# Patient Record
Sex: Male | Born: 1955 | Race: White | Hispanic: No | Marital: Married | Smoking: Never smoker
Health system: Southern US, Community
[De-identification: ages and names within clinical notes are randomized; demographics above are authoritative.]

## PROBLEM LIST (undated history)

## (undated) DIAGNOSIS — E7800 Pure hypercholesterolemia, unspecified: Secondary | ICD-10-CM

## (undated) DIAGNOSIS — N529 Male erectile dysfunction, unspecified: Secondary | ICD-10-CM

## (undated) DIAGNOSIS — E109 Type 1 diabetes mellitus without complications: Secondary | ICD-10-CM

## (undated) HISTORY — DX: Type 1 diabetes mellitus without complications: E10.9

## (undated) HISTORY — PX: WRIST FRACTURE SURGERY: SHX121

## (undated) HISTORY — DX: Pure hypercholesterolemia, unspecified: E78.00

## (undated) HISTORY — PX: COLONOSCOPY: SHX174

## (undated) HISTORY — DX: Male erectile dysfunction, unspecified: N52.9

---

## 2004-08-08 ENCOUNTER — Ambulatory Visit: Payer: Self-pay | Admitting: Endocrinology

## 2004-08-15 ENCOUNTER — Ambulatory Visit: Payer: Self-pay | Admitting: Endocrinology

## 2005-06-18 ENCOUNTER — Ambulatory Visit: Payer: Self-pay | Admitting: Endocrinology

## 2005-06-24 ENCOUNTER — Ambulatory Visit: Payer: Self-pay | Admitting: Endocrinology

## 2005-07-03 ENCOUNTER — Ambulatory Visit: Payer: Self-pay | Admitting: Endocrinology

## 2005-12-02 ENCOUNTER — Ambulatory Visit: Payer: Self-pay | Admitting: Endocrinology

## 2006-05-14 ENCOUNTER — Ambulatory Visit: Payer: Self-pay | Admitting: Endocrinology

## 2006-09-15 ENCOUNTER — Ambulatory Visit: Payer: Self-pay | Admitting: Endocrinology

## 2006-09-15 LAB — CONVERTED CEMR LAB
Alkaline Phosphatase: 56 units/L (ref 39–117)
Basophils Absolute: 0.1 10*3/uL (ref 0.0–0.1)
Basophils Relative: 1.1 % — ABNORMAL HIGH (ref 0.0–1.0)
Bilirubin, Direct: 0.3 mg/dL (ref 0.0–0.3)
Chloride: 107 meq/L (ref 96–112)
Creatinine,U: 220.4 mg/dL
Eosinophils Absolute: 0.2 10*3/uL (ref 0.0–0.6)
GFR calc Af Amer: 132 mL/min
Glucose, Bld: 155 mg/dL — ABNORMAL HIGH (ref 70–99)
Ketones, ur: 40 mg/dL — AB
LDL Cholesterol: 63 mg/dL (ref 0–99)
Leukocytes, UA: NEGATIVE
Lymphocytes Relative: 29.2 % (ref 12.0–46.0)
MCHC: 32.9 g/dL (ref 30.0–36.0)
Microalb, Ur: 0.9 mg/dL (ref 0.0–1.9)
Monocytes Absolute: 0.4 10*3/uL (ref 0.2–0.7)
Platelets: 302 10*3/uL (ref 150–400)
Potassium: 4.7 meq/L (ref 3.5–5.1)
Sodium: 146 meq/L — ABNORMAL HIGH (ref 135–145)
Specific Gravity, Urine: >=1.03 (ref 1.000–1.03)
Total CHOL/HDL Ratio: 2.1
Total Protein, Urine: NEGATIVE mg/dL
Total Protein: 7.5 g/dL (ref 6.0–8.3)
Urobilinogen, UA: 0.2 (ref 0.0–1.0)
pH: 6 (ref 5.0–8.0)

## 2006-10-15 ENCOUNTER — Ambulatory Visit: Payer: Self-pay | Admitting: Endocrinology

## 2006-10-15 LAB — CONVERTED CEMR LAB
Albumin: 4.3 g/dL (ref 3.5–5.2)
Alkaline Phosphatase: 59 units/L (ref 39–117)
Basophils Absolute: 0.1 10*3/uL (ref 0.0–0.1)
Basophils Relative: 1 % (ref 0.0–1.0)
Bilirubin, Direct: 0.2 mg/dL (ref 0.0–0.3)
CO2: 31 meq/L (ref 19–32)
Calcium: 9.4 mg/dL (ref 8.4–10.5)
Chloride: 104 meq/L (ref 96–112)
Creatinine, Ser: 0.8 mg/dL (ref 0.4–1.5)
GFR calc Af Amer: 132 mL/min
HCT: 36.8 % — ABNORMAL LOW (ref 39.0–52.0)
Platelets: 321 10*3/uL (ref 150–400)
RBC: 4.17 M/uL — ABNORMAL LOW (ref 4.22–5.81)
Total Bilirubin: 1.1 mg/dL (ref 0.3–1.2)

## 2006-10-26 ENCOUNTER — Ambulatory Visit: Payer: Self-pay | Admitting: Internal Medicine

## 2007-02-18 ENCOUNTER — Encounter: Payer: Self-pay | Admitting: Endocrinology

## 2007-02-18 DIAGNOSIS — E109 Type 1 diabetes mellitus without complications: Secondary | ICD-10-CM

## 2007-02-18 HISTORY — DX: Type 1 diabetes mellitus without complications: E10.9

## 2007-02-23 ENCOUNTER — Ambulatory Visit: Payer: Self-pay | Admitting: Endocrinology

## 2007-02-23 LAB — CONVERTED CEMR LAB
Basophils Absolute: 0.1 10*3/uL (ref 0.0–0.1)
Calcium: 9.8 mg/dL (ref 8.4–10.5)
Chloride: 99 meq/L (ref 96–112)
Creatinine, Ser: 1 mg/dL (ref 0.4–1.5)
Eosinophils Absolute: 0.1 10*3/uL (ref 0.0–0.6)
GFR calc Af Amer: 102 mL/min
Glucose, Bld: 244 mg/dL — ABNORMAL HIGH (ref 70–99)
Hemoglobin: 14.2 g/dL (ref 13.0–17.0)
Hgb A1c MFr Bld: 7.1 % — ABNORMAL HIGH (ref 4.6–6.0)
MCV: 89 fL (ref 78.0–100.0)
Monocytes Absolute: 0.4 10*3/uL (ref 0.2–0.7)
Neutro Abs: 4.4 10*3/uL (ref 1.4–7.7)
Neutrophils Relative %: 61.4 % (ref 43.0–77.0)
Platelets: 306 10*3/uL (ref 150–400)
RBC: 4.69 M/uL (ref 4.22–5.81)
Sodium: 136 meq/L (ref 135–145)

## 2007-05-14 ENCOUNTER — Encounter: Payer: Self-pay | Admitting: Endocrinology

## 2007-08-19 ENCOUNTER — Encounter (INDEPENDENT_AMBULATORY_CARE_PROVIDER_SITE_OTHER): Payer: Self-pay | Admitting: *Deleted

## 2007-09-06 ENCOUNTER — Ambulatory Visit: Payer: Self-pay | Admitting: Endocrinology

## 2007-09-08 ENCOUNTER — Ambulatory Visit: Payer: Self-pay | Admitting: Endocrinology

## 2007-09-09 ENCOUNTER — Encounter: Payer: Self-pay | Admitting: Endocrinology

## 2007-12-29 ENCOUNTER — Ambulatory Visit: Payer: Self-pay | Admitting: Endocrinology

## 2007-12-29 DIAGNOSIS — E7800 Pure hypercholesterolemia, unspecified: Secondary | ICD-10-CM

## 2007-12-29 HISTORY — DX: Pure hypercholesterolemia, unspecified: E78.00

## 2007-12-29 LAB — CONVERTED CEMR LAB
ALT: 41 units/L (ref 0–53)
AST: 31 units/L (ref 0–37)
Bilirubin, Direct: 0.1 mg/dL (ref 0.0–0.3)
CO2: 29 meq/L (ref 19–32)
Calcium: 9.5 mg/dL (ref 8.4–10.5)
Chloride: 103 meq/L (ref 96–112)
Cholesterol: 140 mg/dL (ref 0–200)
Eosinophils Absolute: 0.3 10*3/uL (ref 0.0–0.7)
GFR calc non Af Amer: 84 mL/min
HDL: 48.8 mg/dL (ref >39.0)
Hgb A1c MFr Bld: 6.8 % — ABNORMAL HIGH (ref 4.6–6.0)
Ketones, ur: NEGATIVE mg/dL
LDL Cholesterol: 81 mg/dL (ref 0–99)
Lymphocytes Relative: 33.9 % (ref 12.0–46.0)
MCHC: 33.9 g/dL (ref 30.0–36.0)
MCV: 90.7 fL (ref 78.0–100.0)
Monocytes Absolute: 0.4 10*3/uL (ref 0.1–1.0)
Monocytes Relative: 7.2 % (ref 3.0–12.0)
Nitrite: NEGATIVE
RBC: 4.62 M/uL (ref 4.22–5.81)
Sodium: 139 meq/L (ref 135–145)
Specific Gravity, Urine: 1.02 (ref 1.000–1.03)
Total Bilirubin: 0.9 mg/dL (ref 0.3–1.2)
Total CHOL/HDL Ratio: 2.9
Triglycerides: 53 mg/dL (ref 0–149)
Urobilinogen, UA: 0.2 (ref 0.0–1.0)
VLDL: 11 mg/dL (ref 0–40)

## 2008-03-07 ENCOUNTER — Telehealth (INDEPENDENT_AMBULATORY_CARE_PROVIDER_SITE_OTHER): Payer: Self-pay | Admitting: *Deleted

## 2008-03-15 ENCOUNTER — Telehealth (INDEPENDENT_AMBULATORY_CARE_PROVIDER_SITE_OTHER): Payer: Self-pay | Admitting: *Deleted

## 2008-04-13 ENCOUNTER — Telehealth (INDEPENDENT_AMBULATORY_CARE_PROVIDER_SITE_OTHER): Payer: Self-pay | Admitting: *Deleted

## 2008-04-25 ENCOUNTER — Encounter: Payer: Self-pay | Admitting: Endocrinology

## 2008-08-07 ENCOUNTER — Ambulatory Visit: Payer: Self-pay | Admitting: Internal Medicine

## 2008-12-27 ENCOUNTER — Telehealth: Payer: Self-pay | Admitting: Endocrinology

## 2008-12-28 ENCOUNTER — Telehealth: Payer: Self-pay | Admitting: Endocrinology

## 2009-03-19 ENCOUNTER — Ambulatory Visit: Payer: Self-pay | Admitting: Diagnostic Radiology

## 2009-03-19 ENCOUNTER — Emergency Department (HOSPITAL_BASED_OUTPATIENT_CLINIC_OR_DEPARTMENT_OTHER): Admission: EM | Admit: 2009-03-19 | Discharge: 2009-03-19 | Payer: Self-pay | Admitting: Emergency Medicine

## 2009-03-28 ENCOUNTER — Telehealth: Payer: Self-pay | Admitting: Endocrinology

## 2009-04-16 ENCOUNTER — Telehealth: Payer: Self-pay | Admitting: Endocrinology

## 2009-05-31 ENCOUNTER — Telehealth: Payer: Self-pay | Admitting: Endocrinology

## 2009-06-08 ENCOUNTER — Ambulatory Visit: Payer: Self-pay | Admitting: Endocrinology

## 2009-06-09 LAB — CONVERTED CEMR LAB
ALT: 28 units/L (ref 0–53)
Basophils Absolute: 0.1 10*3/uL (ref 0.0–0.1)
Bilirubin Urine: NEGATIVE
CO2: 32 meq/L (ref 19–32)
Calcium: 9.7 mg/dL (ref 8.4–10.5)
Chloride: 104 meq/L (ref 96–112)
Cholesterol: 126 mg/dL (ref 0–200)
Eosinophils Absolute: 0.4 10*3/uL (ref 0.0–0.7)
Eosinophils Relative: 6 % — ABNORMAL HIGH (ref 0.0–5.0)
GFR calc non Af Amer: 83.42 mL/min (ref >60)
Glucose, Bld: 172 mg/dL — ABNORMAL HIGH (ref 70–99)
Hemoglobin: 14.5 g/dL (ref 13.0–17.0)
Hgb A1c MFr Bld: 7.7 % — ABNORMAL HIGH (ref 4.6–6.5)
Ketones, ur: NEGATIVE mg/dL
Lymphs Abs: 1.9 10*3/uL (ref 0.7–4.0)
Microalb Creat Ratio: 3.6 mg/g (ref 0.0–30.0)
Monocytes Absolute: 0.4 10*3/uL (ref 0.1–1.0)
Nitrite: NEGATIVE
PSA: 0.95 ng/mL (ref 0.10–4.00)
Platelets: 269 10*3/uL (ref 150.0–400.0)
RDW: 12.3 % (ref 11.5–14.6)
Specific Gravity, Urine: 1.025 (ref 1.000–1.030)
TSH: 1.91 microintl units/mL (ref 0.35–5.50)
Total Protein: 7.3 g/dL (ref 6.0–8.3)
Triglycerides: 45 mg/dL (ref 0.0–149.0)
Urobilinogen, UA: 0.2 (ref 0.0–1.0)
WBC: 6.3 10*3/uL (ref 4.5–10.5)

## 2009-06-21 ENCOUNTER — Ambulatory Visit: Payer: Self-pay | Admitting: Endocrinology

## 2009-09-18 ENCOUNTER — Encounter: Payer: Self-pay | Admitting: Endocrinology

## 2009-09-24 ENCOUNTER — Encounter (INDEPENDENT_AMBULATORY_CARE_PROVIDER_SITE_OTHER): Payer: Self-pay | Admitting: *Deleted

## 2009-10-09 ENCOUNTER — Encounter (INDEPENDENT_AMBULATORY_CARE_PROVIDER_SITE_OTHER): Payer: Self-pay | Admitting: *Deleted

## 2009-10-10 ENCOUNTER — Encounter (INDEPENDENT_AMBULATORY_CARE_PROVIDER_SITE_OTHER): Payer: Self-pay | Admitting: *Deleted

## 2009-10-10 ENCOUNTER — Ambulatory Visit: Payer: Self-pay | Admitting: Gastroenterology

## 2009-10-29 ENCOUNTER — Ambulatory Visit: Payer: Self-pay | Admitting: Gastroenterology

## 2009-10-29 LAB — HM COLONOSCOPY

## 2009-12-03 ENCOUNTER — Telehealth (INDEPENDENT_AMBULATORY_CARE_PROVIDER_SITE_OTHER): Payer: Self-pay | Admitting: *Deleted

## 2010-03-01 ENCOUNTER — Ambulatory Visit: Payer: Self-pay | Admitting: Endocrinology

## 2010-05-14 ENCOUNTER — Telehealth: Payer: Self-pay | Admitting: Endocrinology

## 2010-07-03 ENCOUNTER — Encounter: Payer: Self-pay | Admitting: Endocrinology

## 2010-07-23 ENCOUNTER — Telehealth (INDEPENDENT_AMBULATORY_CARE_PROVIDER_SITE_OTHER): Payer: Self-pay | Admitting: *Deleted

## 2010-08-11 ENCOUNTER — Encounter: Payer: Self-pay | Admitting: Orthopedic Surgery

## 2010-08-13 IMAGING — CR DG FOOT COMPLETE 3+V*L*
3 series · 3 of 3 positions shown · non-contrast
Comparison: None

CLINICAL DATA: Fell several weeks ago with pain

LEFT FOOT - COMPLETE 3+ VIEW

[t foot ap left]
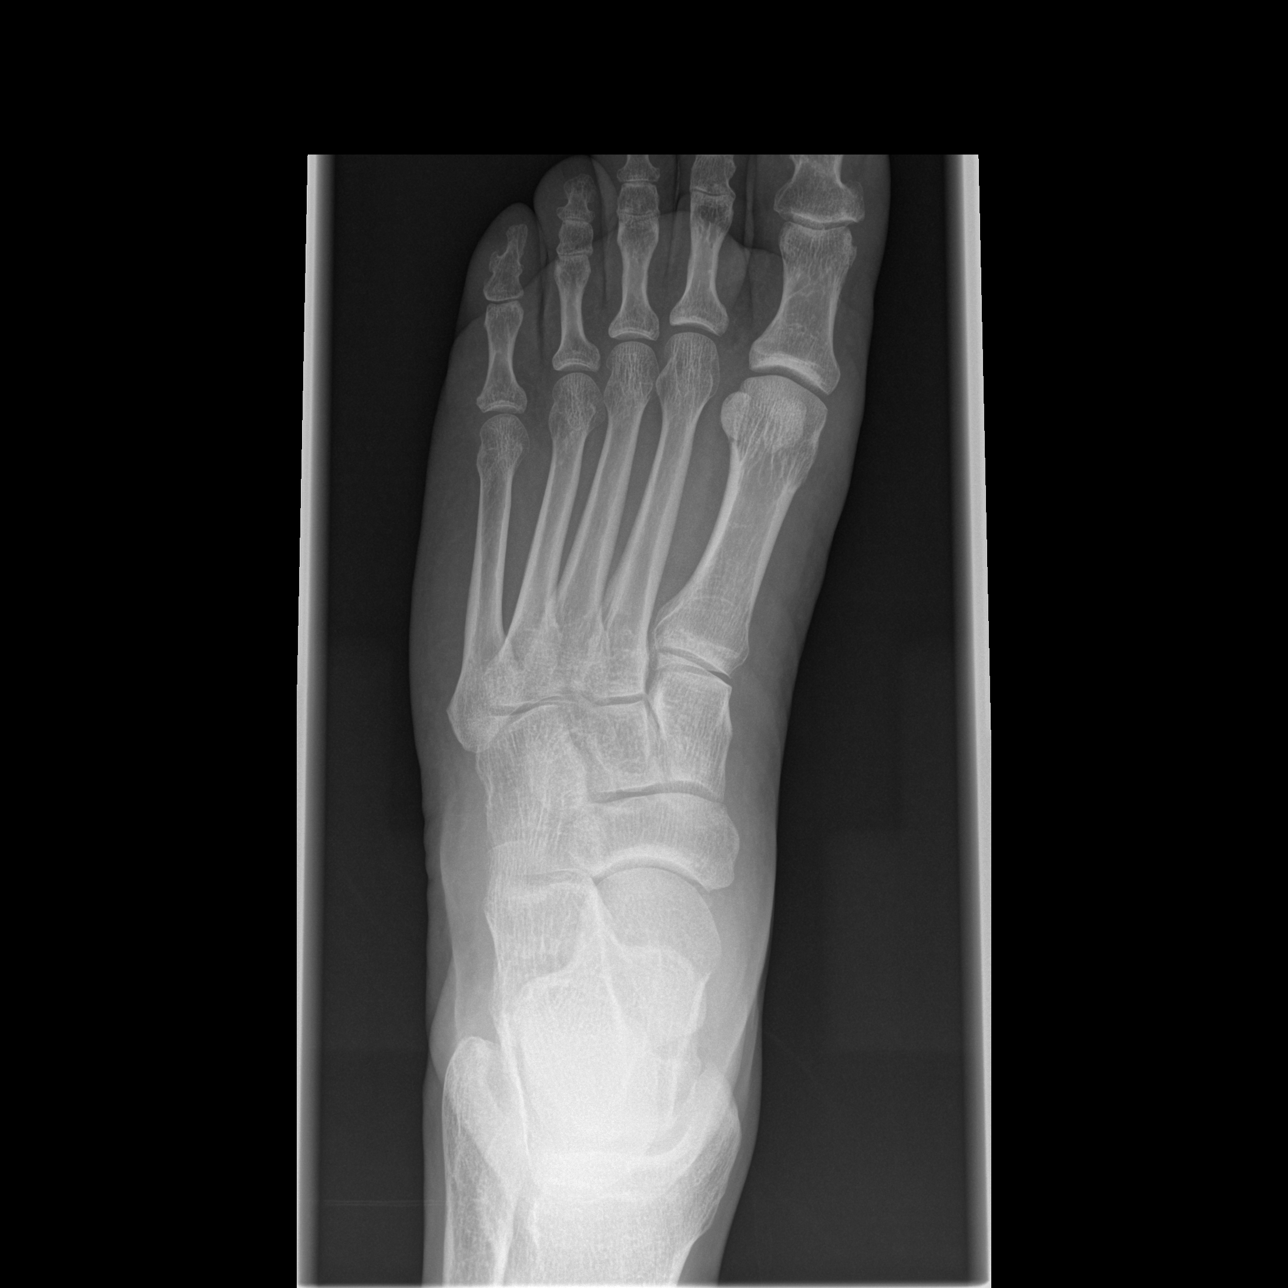

[t foot oblique left]
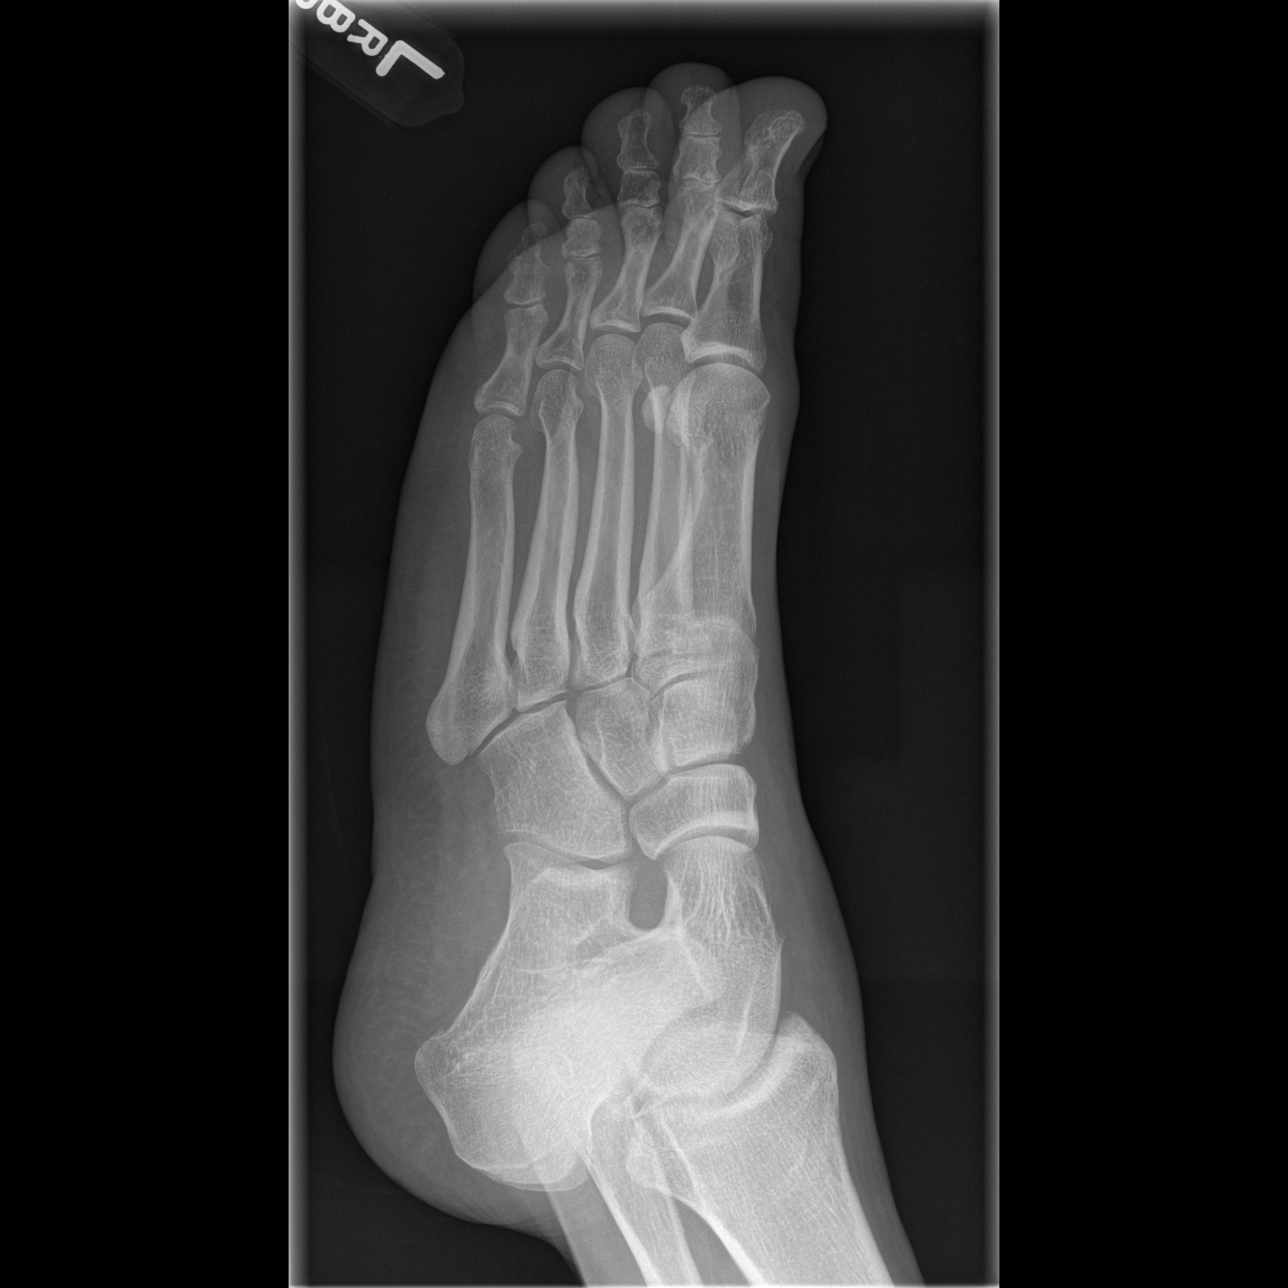

[t foot lat left]
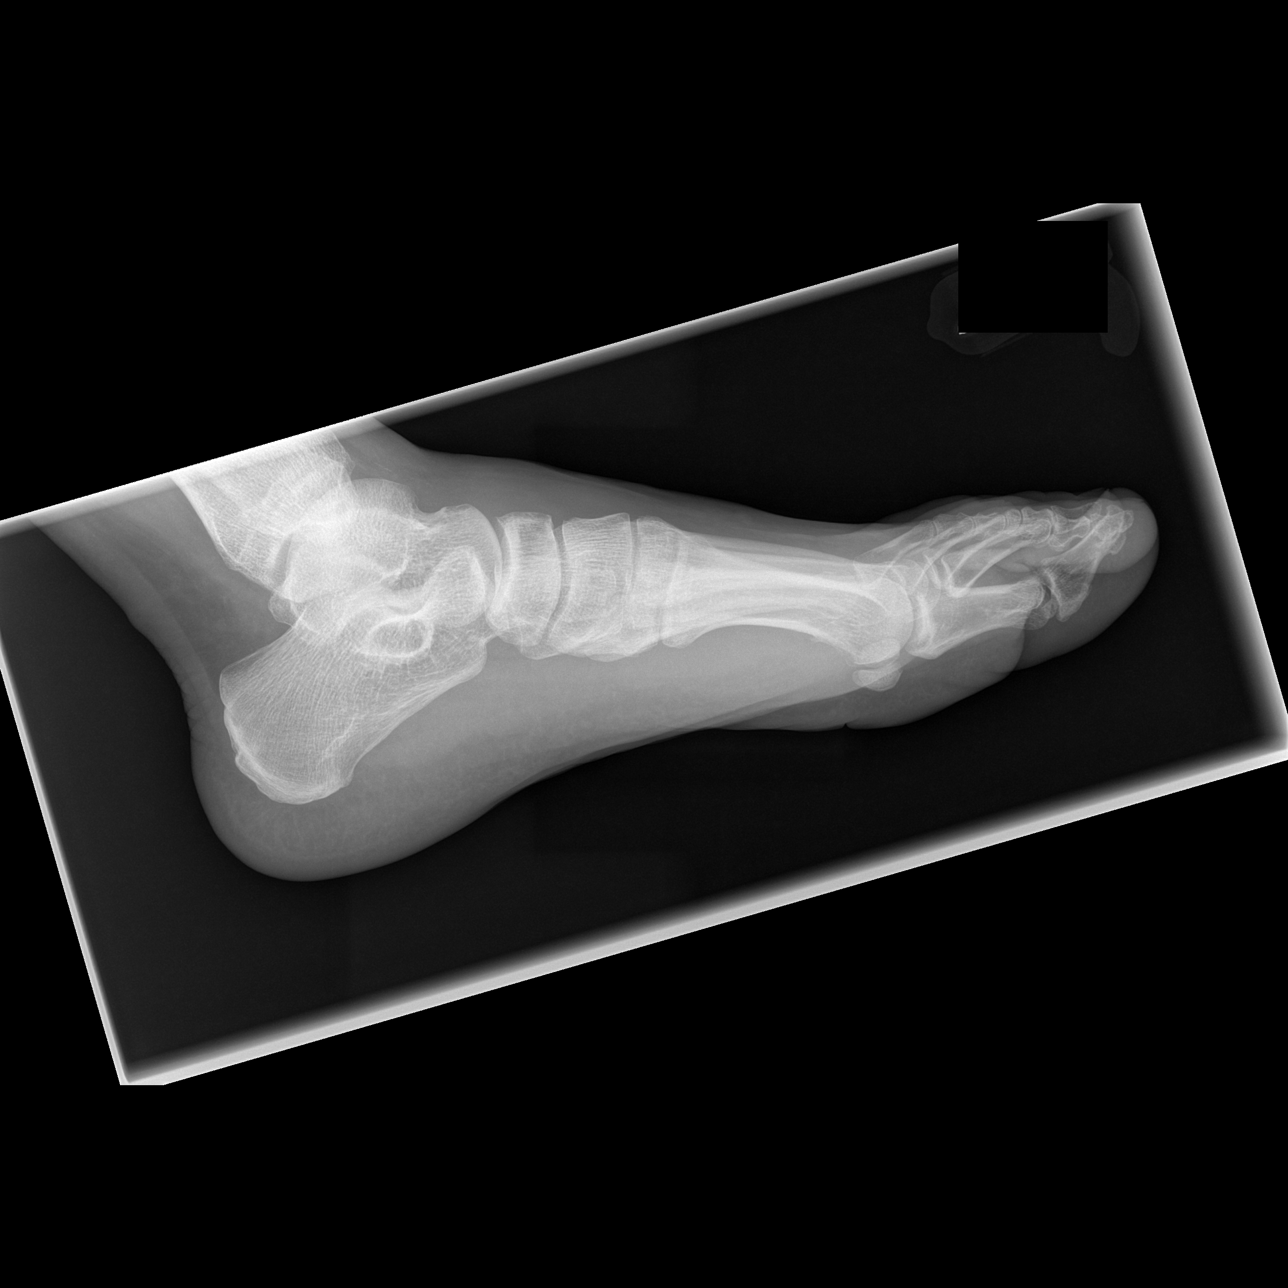

[3 of 3 positions shown; findings below may reference images not displayed]

FINDINGS: No acute fracture is seen.  Tarsal - metatarsal alignment
is normal.  Joint spaces are normal.
IMPRESSION: No acute bony abnormality.

## 2010-08-20 NOTE — Miscellaneous (Signed)
Summary: [facility] Previsit/prep  Clinical Lists Changes  Medications: Added new medication of MOVIPREP 100 GM  SOLR (PEG-KCL-NACL-NASULF-NA ASC-C) As per prep instructions. - Signed Rx of MOVIPREP 100 GM  SOLR (PEG-KCL-NACL-NASULF-NA ASC-C) As per prep instructions.;  #1 x 0;  Signed;  Entered by: Wyona Almas RN;  Authorized by: Mardella Layman MD [facility];  Method used: Electronically to [facility] [phone]*, [address] [place], [place]  [address], Ph: [phone], Fax: [phone] Observations: Added new observation of NKA: T (10/10/2009 8:17)    Prescriptions: MOVIPREP 100 GM  SOLR (PEG-KCL-NACL-NASULF-NA ASC-C) As per prep instructions.  #1 x 0   Entered by:   Wyona Almas RN   Authorized by:   Mardella Layman MD [facility]   Signed by:   Wyona Almas RN on 10/10/2009   Method used:   Electronically to        [facility] [phone]* (retail)       [address]       [place], [place]  [address]       Ph: [phone]       Fax: [phone]   RxID:   [id number]

## 2010-08-20 NOTE — Progress Notes (Signed)
  Phone Note Other Incoming   Request: Send information Summary of Call: Request received from [facility] forwarded to [employer].  .    

## 2010-08-20 NOTE — Procedures (Signed)
Summary: Colonoscopy  Patient: [name] Note: All result statuses are Final unless otherwise noted.  Tests: (1) Colonoscopy (COL)   COL Colonoscopy           DONE     [facility]     [address]     [place], [place]  [address]           COLONOSCOPY PROCEDURE REPORT           PATIENT:  Randy Wallace, Randy Wallace  MR#:  [id number]     BIRTHDATE:  08/08/1955, 53 yrs. old  GENDER:  male     ENDOSCOPIST:  Vania Rea. Jarold Motto, MD, [facility]     REF. BY:  Sean A. Everardo All, M.D.     PROCEDURE DATE:  10/29/2009     PROCEDURE:  Average-risk screening colonoscopy     G0121     ASA CLASS:  Class II     INDICATIONS:  Routine Risk Screening     MEDICATIONS:   Fentanyl 75 mcg IV, Versed 10 mg IV           DESCRIPTION OF PROCEDURE:   After the risks benefits and     alternatives of the procedure were thoroughly explained, informed     consent was obtained.  Digital rectal exam was performed and     revealed no abnormalities.   The LB CF-[id number] endoscope     was introduced through the anus and advanced to the cecum, which     was identified by both the appendix and ileocecal valve, limited     by poor preparation.    The quality of the prep was poor, using     MoviPrep.  The instrument was then slowly withdrawn as the colon     was fully examined.     <<PROCEDUREIMAGES>>           FINDINGS:  No polyps or cancers were seen. CANNOT ECCLUDE SMALL     POLYPS PER POOR PREP.  This was otherwise a normal examination of     the colon.   Retroflexed views in the rectum revealed no     abnormalities.    The scope was then withdrawn from the patient     and the procedure completed.           COMPLICATIONS:  None     ENDOSCOPIC IMPRESSION:     1) No polyps or cancers     2) Otherwise normal examination     POOR PREP EXAM.     RECOMMENDATIONS:     1) Follow up colonoscopy in 5 years     REPEAT EXAM:  No           ______________________________     Vania Rea. Jarold Motto, MD, [facility]            CC:           n.     eSIGNED:   Vania Rea. Patterson at 10/29/2009 10:36 AM           Bettina Gavia, [id number]  Note: An exclamation mark (!) indicates a result that was not dispersed into the flowsheet. Document Creation Date: 10/29/2009 10:36 AM _______________________________________________________________________  (1) Order result status: Final Collection or observation date-time: 10/29/2009 10:31 Requested date-time:  Receipt date-time:  Reported date-time:  Referring Physician:   Ordering Physician: Sheryn Bison [phone]) Specimen Source:  Source: Launa Grill Order Number: [id number] Lab site:   Appended Document: Colonoscopy    Clinical  Lists Changes  Observations: Added new observation of COLONNXTDUE: 10/2014 (10/29/2009 13:44)

## 2010-08-20 NOTE — Letter (Signed)
Summary: [facility] Instructions  [facility]  [address] [place], [place] [address]   Phone: [phone]  Fax: [phone]       IZEAH VOSSLER    December 29, 1955    MRN: [id number]        Procedure Day Dorna Bloom:  Duanne Limerick  10/29/09     Arrival Time:  9:30AM     Procedure Time:  10:30AM     Location of Procedure:                    _X _  [facility] (4th Floor)                        PREPARATION FOR COLONOSCOPY WITH MOVIPREP   Starting 5 days prior to your procedure 10/24/09 do not eat nuts, seeds, popcorn, corn, beans, peas,  salads, or any raw vegetables.  Do not take any fiber supplements (e.g. Metamucil, Citrucel, and Benefiber).  THE DAY BEFORE YOUR PROCEDURE         DATE: 10/28/09 DAY: SUNDAY  1.  Drink clear liquids the entire day-NO SOLID FOOD  2.  Do not drink anything colored red or purple.  Avoid juices with pulp.  No orange juice.  3.  Drink at least 64 oz. (8 glasses) of fluid/clear liquids during the day to prevent dehydration and help the prep work efficiently.  CLEAR LIQUIDS INCLUDE: Water Jello Ice Popsicles Tea (sugar ok, no milk/cream) Powdered fruit flavored drinks Coffee (sugar ok, no milk/cream) Gatorade Juice: apple, white grape, white cranberry  Lemonade Clear bullion, consomm, broth Carbonated beverages (any kind) Strained chicken noodle soup Hard Candy                             4.  In the morning, mix first dose of MoviPrep solution:    Empty 1 Pouch A and 1 Pouch B into the disposable container    Add lukewarm drinking water to the top line of the container. Mix to dissolve    Refrigerate (mixed solution should be used within 24 hrs)  5.  Begin drinking the prep at 5:00 p.m. The MoviPrep container is divided by 4 marks.   Every 15 minutes drink the solution down to the next mark (approximately 8 oz) until the full liter is complete.   6.  Follow completed prep with 16 oz of clear liquid of your choice (Nothing  red or purple).  Continue to drink clear liquids until bedtime.  7.  Before going to bed, mix second dose of MoviPrep solution:    Empty 1 Pouch A and 1 Pouch B into the disposable container    Add lukewarm drinking water to the top line of the container. Mix to dissolve    Refrigerate  THE DAY OF YOUR PROCEDURE      DATE: 10/29/09  DAY: MONDAY  Beginning at 5:30AM (5 hours before procedure):         1. Every 15 minutes, drink the solution down to the next mark (approx 8 oz) until the full liter is complete.  2. Follow completed prep with 16 oz. of clear liquid of your choice.    3. You may drink clear liquids until 8:30AM (2 HOURS BEFORE PROCEDURE).   MEDICATION INSTRUCTIONS  Unless otherwise instructed, you should take regular prescription medications with a small sip of water   as early as possible the morning of  your procedure.  Diabetic patients - see separate instructions.         OTHER INSTRUCTIONS  You will need a responsible adult at least 54 years of age to accompany you and drive you home.   This person must remain in the waiting room during your procedure.  Wear loose fitting clothing that is easily removed.  Leave jewelry and other valuables at home.  However, you may wish to bring a book to read or  an iPod/MP3 player to listen to music as you wait for your procedure to start.  Remove all body piercing jewelry and leave at home.  Total time from sign-in until discharge is approximately 2-3 hours.  You should go home directly after your procedure and rest.  You can resume normal activities the  day after your procedure.  The day of your procedure you should not:   Drive   Make legal decisions   Operate machinery   Drink alcohol   Return to work  You will receive specific instructions about eating, activities and medications before you leave.    The above instructions have been reviewed and explained to me by  Wyona Almas RN  October 10, 2009 8:58 AM     I fully understand and can verbalize these instructions _____________________________ Date _________

## 2010-08-20 NOTE — Letter (Signed)
Summary: [facility] Referral (unable to schedule) Notification  [facility]  [address] [place], [place] [address]   Phone: [phone]  Fax: [phone]      September 18, 2009 Randy Wallace 1956/04/22 MRN: [id number]   Randy Wallace [address] [place], [place]  [address]   Dear Dr. Everardo All:   Thank you for your kind referral of the above patient. We have attempted to schedule the recommended Colonoscopy but have been unable to schedule because:  _x_ The patient was not available by phone and/or has not returned our calls.  __ The patient declined to schedule the procedure at this time.  We appreciate the referral and hope that we will have the opportunity to treat this patient in the future.    Sincerely,   [facility]  Vania Rea. Jarold Motto M.D. Hedwig Morton. Juanda Chance M.D. Venita Lick. Russella Dar M.D. Wilhemina Bonito. Marina Goodell M.D. Barbette Hair. Arlyce Dice M.D. Iva Boop M.D. Cheron Every.D.

## 2010-08-20 NOTE — Letter (Signed)
Summary: Diabetic Instructions  [facility]  [address] [place], [place] [address]   Phone: [phone]  Fax: [phone]    Randy Wallace 30-Dec-1955 MRN: [id number]     ________________________________________________________________________  _ x _   INSULIN (LONG ACTING) MEDICATION INSTRUCTIONS (Lantus, NPH, 70/30, Humulin, Novolin-N)   The day before your procedure:   Take  your regular evening dose    The day of your procedure:   Do not take your morning dose    _ x _   INSULIN (SHORT ACTING) MEDICATION INSTRUCTIONS (Regular, Humulog, Novolog)   The day before your procedure:   Do not take your evening dose   The day of your procedure:   Do not take your morning dose

## 2010-08-20 NOTE — Progress Notes (Signed)
Summary: Rx refill req  Phone Note Refill Request Message from:  Patient on May 14, 2010 12:56 PM  Refills Requested: Medication #1:  HUMULIN N PEN 100 UNIT/ML  SUSP 17 units qhs   Dosage confirmed as above?Dosage Confirmed   Supply Requested: 6 months  Method Requested: Electronic Initial call taken by: Margaret Pyle, CMA,  May 14, 2010 12:56 PM    Prescriptions: HUMULIN N PEN 100 UNIT/ML  SUSP (INSULIN ISOPHANE HUMAN) 17 units qhs  #3 x 1   Entered by:   Margaret Pyle, CMA   Authorized by:   Minus Breeding MD   Signed by:   Margaret Pyle, CMA on 05/14/2010   Method used:   Electronically to        [facility]* (retail)             ,          Ph: [phone]       Fax: [phone]   RxID:   [id number]

## 2010-08-20 NOTE — Letter (Signed)
Summary: Previsit letter  [facility]  [address] [place], [place] [address]   Phone: [phone]  Fax: [phone]       09/24/2009 MRN: [id number]  Randy Wallace [address] [place], [place]  [address]  Dear Randy Wallace,  Welcome to the Gastroenterology Division at [facility].    You are scheduled to see a nurse for your pre-procedure visit on 10-10-09 at 8:30am on the 3rd floor at [facility], [address]  We ask that you try to arrive at our office 15 minutes prior to your appointment time to allow for check-in.  Your nurse visit will consist of discussing your medical and surgical history, your immediate family medical history, and your medications.    Please bring a complete list of all your medications or, if you prefer, bring the medication bottles and we will list them.  We will need to be aware of both prescribed and over the counter drugs.  We will need to know exact dosage information as well.  If you are on blood thinners (Coumadin, Plavix, Aggrenox, Ticlid, etc.) please call our office today/prior to your appointment, as we need to consult with your physician about holding your medication.   Please be prepared to read and sign documents such as consent forms, a financial agreement, and acknowledgement forms.  If necessary, and with your consent, a friend or relative is welcome to sit-in on the nurse visit with you.  Please bring your insurance card so that we may make a copy of it.  If your insurance requires a referral to see a specialist, please bring your referral form from your primary care physician.  No co-pay is required for this nurse visit.     If you cannot keep your appointment, please call [phone] to cancel or reschedule prior to your appointment date.  This allows [place] the opportunity to schedule an appointment for another patient in need of care.    Thank you for choosing [facility] for your medical needs.  We  appreciate the opportunity to care for you.  Please visit [place] at our website  to learn more about our practice.                     Sincerely.                                                                                                                   The Gastroenterology Division

## 2010-08-20 NOTE — Assessment & Plan Note (Signed)
Summary: cut toe/cd   Vital Signs:  Patient profile:   55 year old male Height:      67 inches (170.18 cm) Weight:      166 pounds (75.45 kg) BMI:     26.09 O2 Sat:      97 % on Room air Temp:     98.1 degrees F (36.72 degrees C) oral Pulse rate:   72 / minute BP sitting:   98 / 70  (left arm) Cuff size:   regular  Vitals Entered By: Brenton Grills MA (March 01, 2010 9:20 AM)  O2 Flow:  Room air CC: cut right toe/aj Is Patient Diabetic? Yes   CC:  cut right toe/aj.  History of Present Illness: pt states 1 week of slight pain at the intertrigenous area between the right 4th and 5th toes.  there is associated numbness. no cbg record, but states cbg's are well-controlled.  he seldom has hypoglycemia, and these episodes are mild.   Current Medications (verified): 1)  Mevacor 40 Mg  Tabs (Lovastatin) .... Take 2 By Mouth Qd 2)  Aspirin 325 Mg  Tbec (Aspirin) .... Take 1 By Mouth Qd 3)  Humulin N Pen 100 Unit/ml  Susp (Insulin Isophane Human) .Marland KitchenMarland Kitchen. 17 Units Qhs 4)  Bd Ultra-Fine Pen Needles 29g X 12.65mm  Misc (Insulin Pen Needle) .... Use 5 Times A Day 5)  Onetouch Ultra Test  Strp (Glucose Blood) .... 6/day, and Lancets, Variable Glucoses 250.01 6)  Bd Insulin Syringe Ultrafine 31g X 5/16" 0.3 Ml Misc (Insulin Syringe-Needle U-100) 7)  Veramyst 27.5 Mcg/spray Susp (Fluticasone Furoate) .... 2 Sprays Each Side Qd 8)  Humalog 100 Unit/ml Soln (Insulin Lispro (Human)) .Marland Kitchen.. 10 Units Three Times A Day (Qac)  Allergies (verified): No Known Drug Allergies  Past History:  Past Medical History: Last updated: 02/18/2007 Diabetes mellitus, type I Dyslipidemia E.D. Rest Cardiolite (11/30/2001) EKG (09/15/2006)  Review of Systems  The patient denies fever and syncope.    Physical Exam  General:  normal appearance.   Extremities:  right foot, between 4th and 5th toes:  there is healing ulcer, with no open ulcer.  no drainage. Additional Exam:  Hemoglobin A1C       [H]  7.1 %     Impression & Recommendations:  Problem # 1:  foot ulcer very mild. new  Problem # 2:  DIABETES MELLITUS, TYPE I (ICD-250.01) needs increased rx  Other Orders: TLB-A1C / Hgb A1C (Glycohemoglobin) (83036-A1C) Est. Patient Level IV ([address])  Patient Instructions: 1)  blood tests are being ordered for you today.  please call [phone] to hear your test results. 2)  keep the area covered with antibiotic ointment and bandaid, until healed.   3)  physical is due in 4 months. 4)  (update: i left message on phone-tree:  determine what time of day cbg is highest, so we can safely increase insulin).

## 2010-08-22 NOTE — Progress Notes (Signed)
  Phone Note Other Incoming   Request: Send information Summary of Call: Request for records received from [employer]. Request forwarded to [employer].     

## 2010-08-22 NOTE — Letter (Signed)
Summary: [facility]   Imported By: Lester [place] 07/09/2010 10:10:58  _____________________________________________________________________  External Attachment:    Type:   Image     Comment:   External Document

## 2010-10-09 LAB — GLUCOSE, CAPILLARY: Glucose-Capillary: 155 mg/dL — ABNORMAL HIGH (ref 70–99)

## 2010-11-04 ENCOUNTER — Telehealth: Payer: Self-pay | Admitting: *Deleted

## 2010-11-04 NOTE — Telephone Encounter (Signed)
Pt will be leaving for [place] at the end of May x 7 days. He wants to know if he needs any immunizations before leaving.

## 2010-11-04 NOTE — Telephone Encounter (Signed)
i am unaware of any hots pt needs, but f/u ov is due.  We can address further then.

## 2010-11-04 NOTE — Telephone Encounter (Signed)
Pt aware, he will call back to schedule f/u ov

## 2010-11-15 ENCOUNTER — Ambulatory Visit (INDEPENDENT_AMBULATORY_CARE_PROVIDER_SITE_OTHER): Payer: 59 | Admitting: Endocrinology

## 2010-11-15 ENCOUNTER — Encounter: Payer: Self-pay | Admitting: Endocrinology

## 2010-11-15 DIAGNOSIS — Z0000 Encounter for general adult medical examination without abnormal findings: Secondary | ICD-10-CM

## 2010-11-15 DIAGNOSIS — Z125 Encounter for screening for malignant neoplasm of prostate: Secondary | ICD-10-CM

## 2010-11-15 DIAGNOSIS — E109 Type 1 diabetes mellitus without complications: Secondary | ICD-10-CM

## 2010-11-15 MED ORDER — ONDANSETRON HCL 4 MG PO TABS: 4.0000 mg | ORAL_TABLET | Freq: Three times a day (TID) | ORAL | Status: AC | PRN

## 2010-11-15 MED ORDER — CIPROFLOXACIN HCL 500 MG PO TABS: 500.0000 mg | ORAL_TABLET | Freq: Two times a day (BID) | ORAL | Status: AC

## 2010-11-15 NOTE — Patient Instructions (Addendum)
blood tests are being ordered for you today.  please call [phone] to hear your test results. good diet and exercise habits significanly improve the control of your diabetes.  please let me know if you wish to be referred to a dietician.  high blood sugar is very risky to your health.  you should see an eye doctor every year. controlling your blood pressure and cholesterol drastically reduces the damage diabetes does to your body.  this also applies to quitting smoking.  please discuss these with your doctor.  you should take an aspirin every day, unless you have been advised by a doctor not to. Please make a follow-up appointment in 3 months. Please consider a continuous glucose monitor.  Let me know if you want to see a diabetes educator to consider this.   Here are 2 meds for your trip to [place].

## 2010-11-15 NOTE — Progress Notes (Signed)
  Subjective:    Patient ID: Randy Wallace, male    DOB: April 03, 1956, 55 y.o.   MRN: [id number]  HPI The state of at least three ongoing medical problems is addressed today: no cbg record, but states cbg's are well-controlled.  pt states he feels well in general. Pt says he will soon travel to [place] to see his dtr, who will study there. He has h/o dyslipidemia.  He takes meds as rx'ed, and tolerates well. Past Medical History  Diagnosis Date  . DIABETES MELLITUS, TYPE I 02/18/2007  . HYPERCHOLESTEROLEMIA 12/29/2007  . ED (erectile dysfunction)    No past surgical history on file.  reports that he has never smoked. He does not have any smokeless tobacco history on file. He reports that he drinks alcohol. His drug history not on file. family history includes Cancer in his father and Leukemia in his mother. No Known Allergies   Review of Systems He has hypoglycemia approx every few weeks.  Denies loc    Objective:   Physical Exam GENERAL: no distress Neck - No masses or thyromegaly or limitation in range of motion HEART: Regular rate and rhythm without murmurs noted. Normal S1,S2.   LUNGS:  Clear to auscultation Pulses: dorsalis pedis intact bilat.   Feet: no deformity.  no ulcer on the feet.  feet are of normal color and temp.  no edema Neuro: sensation is intact to touch on the feet Alert and oriented x 3.  Does not appear anxious nor depressed     Assessment & Plan:  Dm, apparently well-controlled upcoming travel to [place] Dyslipidemia, on rx

## 2010-11-22 ENCOUNTER — Other Ambulatory Visit: Payer: Self-pay | Admitting: Endocrinology

## 2010-12-31 ENCOUNTER — Other Ambulatory Visit (INDEPENDENT_AMBULATORY_CARE_PROVIDER_SITE_OTHER): Payer: 59

## 2010-12-31 DIAGNOSIS — Z0000 Encounter for general adult medical examination without abnormal findings: Secondary | ICD-10-CM

## 2010-12-31 DIAGNOSIS — E109 Type 1 diabetes mellitus without complications: Secondary | ICD-10-CM

## 2010-12-31 DIAGNOSIS — Z125 Encounter for screening for malignant neoplasm of prostate: Secondary | ICD-10-CM

## 2010-12-31 LAB — LIPID PANEL
Cholesterol: 124 mg/dL (ref 0–200)
LDL Cholesterol: 62 mg/dL (ref 0–99)
Triglycerides: 27 mg/dL (ref 0.0–149.0)
VLDL: 5.4 mg/dL (ref 0.0–40.0)

## 2010-12-31 LAB — CBC WITH DIFFERENTIAL/PLATELET
Basophils Absolute: 0.1 10*3/uL (ref 0.0–0.1)
Eosinophils Absolute: 0.3 10*3/uL (ref 0.0–0.7)
Hemoglobin: 13 g/dL (ref 13.0–17.0)
Lymphocytes Relative: 38.6 % (ref 12.0–46.0)
Lymphs Abs: 2.2 10*3/uL (ref 0.7–4.0)
MCHC: 33.9 g/dL (ref 30.0–36.0)
Neutro Abs: 2.8 10*3/uL (ref 1.4–7.7)
Platelets: 228 10*3/uL (ref 150.0–400.0)
RDW: 13.2 % (ref 11.5–14.6)

## 2010-12-31 LAB — BASIC METABOLIC PANEL
Calcium: 8.8 mg/dL (ref 8.4–10.5)
Chloride: 105 mEq/L (ref 96–112)
Creatinine, Ser: 0.9 mg/dL (ref 0.4–1.5)
Sodium: 137 mEq/L (ref 135–145)

## 2010-12-31 LAB — HEMOGLOBIN A1C: Hgb A1c MFr Bld: 7.4 % — ABNORMAL HIGH (ref 4.6–6.5)

## 2010-12-31 LAB — TSH: TSH: 3.44 u[IU]/mL (ref 0.35–5.50)

## 2010-12-31 LAB — HEPATIC FUNCTION PANEL
AST: 40 U/L — ABNORMAL HIGH (ref 0–37)
Bilirubin, Direct: 0.2 mg/dL (ref 0.0–0.3)
Total Bilirubin: 0.9 mg/dL (ref 0.3–1.2)

## 2010-12-31 LAB — MICROALBUMIN / CREATININE URINE RATIO: Microalb Creat Ratio: 0.3 mg/g (ref 0.0–30.0)

## 2011-01-26 ENCOUNTER — Other Ambulatory Visit: Payer: Self-pay | Admitting: Endocrinology

## 2011-02-10 ENCOUNTER — Telehealth: Payer: Self-pay

## 2011-02-10 NOTE — Telephone Encounter (Signed)
Ok.  a1c i 250.01, and lft is 573.9

## 2011-02-10 NOTE — Telephone Encounter (Signed)
Pt called stating he has an appt 08/02 and would like MD to advise if he needs to have labs done prior to appt (A1C and Hepatic), please advise.

## 2011-02-11 NOTE — Telephone Encounter (Signed)
Pt advised and scheduled for labs 07/26 as he requested.

## 2011-02-13 ENCOUNTER — Ambulatory Visit: Payer: 59

## 2011-02-13 DIAGNOSIS — K769 Liver disease, unspecified: Secondary | ICD-10-CM

## 2011-02-13 DIAGNOSIS — E109 Type 1 diabetes mellitus without complications: Secondary | ICD-10-CM

## 2011-02-13 LAB — HEPATIC FUNCTION PANEL
ALT: 23 U/L (ref 0–53)
AST: 23 U/L (ref 0–37)
Albumin: 4.3 g/dL (ref 3.5–5.2)

## 2011-02-13 LAB — HEMOGLOBIN A1C: Hgb A1c MFr Bld: 7.1 % — ABNORMAL HIGH (ref 4.6–6.5)

## 2011-02-20 ENCOUNTER — Ambulatory Visit (INDEPENDENT_AMBULATORY_CARE_PROVIDER_SITE_OTHER): Payer: 59 | Admitting: Endocrinology

## 2011-02-20 ENCOUNTER — Encounter: Payer: Self-pay | Admitting: Endocrinology

## 2011-02-20 VITALS — BP 102/70 | HR 65 | Temp 98.5°F | Ht 70.0 in | Wt 171.8 lb

## 2011-02-20 DIAGNOSIS — E109 Type 1 diabetes mellitus without complications: Secondary | ICD-10-CM

## 2011-02-20 NOTE — Patient Instructions (Signed)
Please make a follow-up appointment in 3 months. Please consider a continuous glucose monitor.  Let me know if you want to see a diabetes educator to consider this.   Please call if you notice a hernia at the left side of your abdomen.

## 2011-02-20 NOTE — Progress Notes (Signed)
  Subjective:    Patient ID: Randy Wallace, male    DOB: [birth year], 55 y.o.   MRN: [id number]  HPI Pt states 6 weeks of slight prominence at the left anterior hip, in the context of lifting weights.  No assoc pain. no cbg record, but states cbg's are well-controlled, except for mild hypoglycemia.  He says this usually happens after eating a snack, and taking to much insulin.   Past Medical History  Diagnosis Date  . DIABETES MELLITUS, TYPE I 02/18/2007  . HYPERCHOLESTEROLEMIA 12/29/2007  . ED (erectile dysfunction)     No past surgical history on file.  History   Social History  . Marital Status: Married    Spouse Name: N/A    Number of Children: 2  . Years of Education: N/A   Occupational History  . Science writer    Social History Main Topics  . Smoking status: Never Smoker   . Smokeless tobacco: Not on file  . Alcohol Use: Yes  . Drug Use:   . Sexually Active:    Other Topics Concern  . Not on file   Social History Narrative  . No narrative on file    Current Outpatient Prescriptions on File Prior to Visit  Medication Sig Dispense Refill  . aspirin 325 MG tablet Take 325 mg by mouth daily.        . BD INSULIN SYRINGE ULTRAFINE 31G X 5/16" 0.3 ML MISC USE AS DIRECTED (SCHEDULE OFFICE VISIT)  540 each  0  . fluticasone (VERAMYST) 27.5 MCG/SPRAY nasal spray 2 sprays by Nasal route as needed.       Marland Kitchen HUMALOG 100 UNIT/ML injection INJECT 10 UNITS THREE TIMES A DAY BEFORE MEALS  3 vial  1  . insulin NPH (HUMULIN N PEN) 100 UNIT/ML injection Inject 17 Units into the skin at bedtime.        . Insulin Pen Needle 29G X 12.7MM MISC Use 5 times a day       . lovastatin (MEVACOR) 40 MG tablet Take 2 tablets by mouth once daily       . ONE TOUCH ULTRA TEST test strip USE 6 TIMES PER DAY  6 each  1    No Known Allergies  Family History  Problem Relation Age of Onset  . Leukemia Mother   . Cancer Father     Stomach Cancer   BP 102/70  Pulse 65  Temp(Src) 98.5 F  (36.9 C) (Oral)  Ht 5\' 10"  (1.778 m)  Wt 171 lb 12.8 oz (77.928 kg)  BMI 24.65 kg/m2  SpO2 98%  Review of Systems Denies notable hernia.  Denies loc.    Objective:   Physical Exam GENERAL: no distress Abd: i do not see a hernia or other prominence.    Lab Results  Component Value Date   HGBA1C 7.1* 02/13/2011  lft: normal    Assessment & Plan:  elev lft, resolved.   Dm, needs increased rx prob a muscle strain at the left abdomen (vs early hernia).

## 2011-03-27 ENCOUNTER — Other Ambulatory Visit: Payer: Self-pay | Admitting: Endocrinology

## 2011-04-09 ENCOUNTER — Ambulatory Visit: Payer: 59 | Admitting: Endocrinology

## 2011-04-10 ENCOUNTER — Ambulatory Visit: Payer: 59 | Admitting: Endocrinology

## 2011-04-14 ENCOUNTER — Encounter: Payer: Self-pay | Admitting: Endocrinology

## 2011-04-14 ENCOUNTER — Ambulatory Visit (INDEPENDENT_AMBULATORY_CARE_PROVIDER_SITE_OTHER): Payer: 59 | Admitting: Endocrinology

## 2011-04-14 DIAGNOSIS — R1032 Left lower quadrant pain: Secondary | ICD-10-CM

## 2011-04-14 NOTE — Progress Notes (Signed)
  Subjective:    Patient ID: Randy Wallace, male    DOB: [birth year], 55 y.o.   MRN: [id number]  HPI Pt states a few mos of slight pain at the left inguinal area.  No assoc dysuria Past Medical History  Diagnosis Date  . DIABETES MELLITUS, TYPE I 02/18/2007  . HYPERCHOLESTEROLEMIA 12/29/2007  . ED (erectile dysfunction)     No past surgical history on file.  History   Social History  . Marital Status: Married    Spouse Name: N/A    Number of Children: 2  . Years of Education: N/A   Occupational History  . Science writer    Social History Main Topics  . Smoking status: Never Smoker   . Smokeless tobacco: Not on file  . Alcohol Use: Yes  . Drug Use:   . Sexually Active:    Other Topics Concern  . Not on file   Social History Narrative  . No narrative on file    Current Outpatient Prescriptions on File Prior to Visit  Medication Sig Dispense Refill  . aspirin 325 MG tablet Take 325 mg by mouth daily.        . fluticasone (VERAMYST) 27.5 MCG/SPRAY nasal spray 2 sprays by Nasal route as needed.       Marland Kitchen HUMALOG 100 UNIT/ML injection INJECT 10 UNITS THREE TIMES A DAY BEFORE MEALS  3 vial  1  . insulin NPH (HUMULIN N PEN) 100 UNIT/ML injection Inject 17 Units into the skin at bedtime.        . Insulin Pen Needle 29G X 12.7MM MISC Use 5 times a day       . Insulin Syringe-Needle U-100 (BD INSULIN SYRINGE ULTRAFINE) 31G X 5/16" 0.3 ML MISC Use as directed  540 each  1  . lovastatin (MEVACOR) 40 MG tablet Take 2 tablets by mouth once daily       . ONE TOUCH ULTRA TEST test strip USE 6 TIMES PER DAY  6 each  1    No Known Allergies  Family History  Problem Relation Age of Onset  . Leukemia Mother   . Cancer Father     Stomach Cancer    BP 102/62  Pulse 59  Temp(Src) 98.8 F (37.1 C) (Oral)  Ht 5\' 10"  (1.778 m)  Wt 167 lb (75.751 kg)  BMI 23.96 kg/m2  SpO2 97%    Review of Systems denies hypoglycemia and hematuria.      Objective:   Physical  Exam VITAL SIGNS:  See vs page GENERAL: no distress Left inguinal area: no hernia or tenderness. GENITALIA:  Normal male testicles, scrotum, and penis       Assessment & Plan:  Inguinal pain, uncertain etiology.  prob a muscle strain

## 2011-04-14 NOTE — Patient Instructions (Addendum)
Although it has already been 4 months since your symptoms have started, you should give it more time and rest.   I hope you feel better soon.  If you don't feel better, please call back, so we can do the ct scan.   If we do the ct scan, we would also need to do the kidney blood tests, so you may want to coordinate this with your next a1c.

## 2011-05-22 ENCOUNTER — Telehealth: Payer: Self-pay | Admitting: *Deleted

## 2011-05-22 DIAGNOSIS — R1032 Left lower quadrant pain: Secondary | ICD-10-CM

## 2011-05-22 NOTE — Telephone Encounter (Signed)
Pt states that he was seen at [facility] in September for groin pain and was told that if he still had pain to callback to schedule an CT of his groin; pt is still having groin pain and wants to proceed with CT of groin-please advise

## 2011-05-22 NOTE — Telephone Encounter (Signed)
i ordered

## 2011-05-22 NOTE — Telephone Encounter (Signed)
Left message for pt to callback office.  

## 2011-05-23 NOTE — Telephone Encounter (Signed)
Pt informed of referral for CT.

## 2011-05-30 ENCOUNTER — Other Ambulatory Visit: Payer: 59

## 2011-06-05 ENCOUNTER — Other Ambulatory Visit: Payer: 59

## 2011-06-18 ENCOUNTER — Telehealth: Payer: Self-pay | Admitting: *Deleted

## 2011-06-18 ENCOUNTER — Other Ambulatory Visit (INDEPENDENT_AMBULATORY_CARE_PROVIDER_SITE_OTHER): Payer: 59

## 2011-06-18 DIAGNOSIS — Z79899 Other long term (current) drug therapy: Secondary | ICD-10-CM

## 2011-06-18 DIAGNOSIS — E109 Type 1 diabetes mellitus without complications: Secondary | ICD-10-CM

## 2011-06-18 LAB — BASIC METABOLIC PANEL
CO2: 25 mEq/L (ref 19–32)
Calcium: 9.4 mg/dL (ref 8.4–10.5)
Creatinine, Ser: 1 mg/dL (ref 0.4–1.5)
Glucose, Bld: 154 mg/dL — ABNORMAL HIGH (ref 70–99)
Sodium: 139 mEq/L (ref 135–145)

## 2011-06-18 NOTE — Telephone Encounter (Signed)
Pt in the lab have the order for A1C, but pt states he was told to have kidneys check prior to CT scan. Need or der in epic....05/2811@12 :43pm/LMB

## 2011-06-27 ENCOUNTER — Ambulatory Visit (INDEPENDENT_AMBULATORY_CARE_PROVIDER_SITE_OTHER)
Admission: RE | Admit: 2011-06-27 | Discharge: 2011-06-27 | Disposition: A | Discharge status: Home / Self Care | Payer: 59 | Source: Ambulatory Visit | Attending: Endocrinology | Admitting: Endocrinology

## 2011-06-27 DIAGNOSIS — R1032 Left lower quadrant pain: Secondary | ICD-10-CM

## 2011-06-27 MED ORDER — IOHEXOL 300 MG/ML  SOLN
80.0000 mL | Freq: Once | INTRAMUSCULAR | Status: AC | PRN
  Administered 2011-06-27: 80 mL via INTRAVENOUS

## 2011-07-10 ENCOUNTER — Other Ambulatory Visit: Payer: Self-pay | Admitting: Endocrinology

## 2011-07-18 ENCOUNTER — Other Ambulatory Visit: Payer: Self-pay | Admitting: Endocrinology

## 2011-07-23 ENCOUNTER — Other Ambulatory Visit: Payer: Self-pay

## 2011-07-23 MED ORDER — INSULIN LISPRO 100 UNIT/ML ~~LOC~~ SOLN: 10.0000 [IU] | Freq: Three times a day (TID) | SUBCUTANEOUS | Status: DC

## 2011-08-04 ENCOUNTER — Encounter: Payer: Self-pay | Admitting: Endocrinology

## 2011-09-30 ENCOUNTER — Other Ambulatory Visit: Payer: Self-pay

## 2011-09-30 MED ORDER — "INSULIN SYRINGE-NEEDLE U-100 31G X 5/16"" 0.3 ML MISC": Status: DC

## 2011-10-09 ENCOUNTER — Telehealth: Payer: Self-pay

## 2011-10-09 MED ORDER — "PEN NEEDLES 5/16"" 31G X 8 MM MISC": 1.0000 | Freq: Every day | Status: DC

## 2011-10-09 NOTE — Telephone Encounter (Signed)
Pt called requesting Rx for PEN NEEDLES. He states he no longer using the vial and syringes.

## 2012-01-12 ENCOUNTER — Encounter: Payer: Self-pay | Admitting: *Deleted

## 2012-01-12 NOTE — Progress Notes (Signed)
Mr. Bartoszek presented today to pick up the reimbursement check for the incorrect prescription that was sent to his mail order pharmacy.

## 2012-02-09 ENCOUNTER — Telehealth: Payer: Self-pay

## 2012-02-09 MED ORDER — GLUCOSE BLOOD VI STRP: ORAL_STRIP | Status: DC

## 2012-02-09 NOTE — Telephone Encounter (Signed)
Pt called requesting a 30 day supply of test strips to local pharmacy because he will be going out of town and will not get mail order shipment in time.

## 2012-02-10 ENCOUNTER — Other Ambulatory Visit: Payer: Self-pay | Admitting: Endocrinology

## 2012-02-10 ENCOUNTER — Other Ambulatory Visit: Payer: Self-pay

## 2012-02-10 ENCOUNTER — Other Ambulatory Visit: Payer: Self-pay | Admitting: *Deleted

## 2012-02-10 MED ORDER — GLUCOSE BLOOD VI STRP: ORAL_STRIP | Status: DC

## 2012-02-10 NOTE — Telephone Encounter (Signed)
R'cd fax from [facility] for more specific instructions on pt's test strips refill per insurance.

## 2012-02-25 ENCOUNTER — Other Ambulatory Visit: Payer: Self-pay

## 2012-02-25 MED ORDER — GLUCOSE BLOOD VI STRP: ORAL_STRIP | Status: DC

## 2012-03-09 ENCOUNTER — Other Ambulatory Visit: Payer: Self-pay | Admitting: Endocrinology

## 2012-04-13 ENCOUNTER — Other Ambulatory Visit: Payer: Self-pay

## 2012-04-13 MED ORDER — INSULIN NPH (HUMAN) (ISOPHANE) 100 UNIT/ML ~~LOC~~ SUSP: 17.0000 [IU] | Freq: Every day | SUBCUTANEOUS | Status: DC

## 2012-04-13 MED ORDER — "PEN NEEDLES 5/16"" 31G X 8 MM MISC": 1.0000 | Freq: Every day | Status: DC

## 2012-04-13 MED ORDER — GLUCOSE BLOOD VI STRP: ORAL_STRIP | Status: DC

## 2012-06-10 ENCOUNTER — Other Ambulatory Visit: Payer: Self-pay

## 2012-06-10 ENCOUNTER — Telehealth: Payer: Self-pay | Admitting: Endocrinology

## 2012-06-10 MED ORDER — INSULIN LISPRO 100 UNIT/ML ~~LOC~~ SOLN: 10.0000 [IU] | Freq: Three times a day (TID) | SUBCUTANEOUS | Status: DC

## 2012-06-10 NOTE — Telephone Encounter (Signed)
Ok to change humalog to [employer]. cpx is due

## 2012-06-10 NOTE — Telephone Encounter (Signed)
Patient Randy Wallace, PCP Everardo All, calling in requesting a refill on his HUMALOG KWIK PEN medication (not humalog N)  ONLY HUMALOG KWICK PEN . Patient states his insurance carrier has switched form [employer] to now- Pharmacy: [facility] - [place], [place] - [address].   He is asking we send his three months supply to his new Journalist, newspaper.  Advised I would forward request to the office. Understanding expressed

## 2012-06-14 ENCOUNTER — Other Ambulatory Visit: Payer: Self-pay | Admitting: *Deleted

## 2012-06-14 MED ORDER — GLUCOSE BLOOD VI STRP: ORAL_STRIP | Status: DC

## 2012-07-07 ENCOUNTER — Other Ambulatory Visit: Payer: Self-pay | Admitting: *Deleted

## 2012-07-07 MED ORDER — LOVASTATIN 40 MG PO TABS: ORAL_TABLET | ORAL | Status: DC

## 2012-07-07 NOTE — Telephone Encounter (Signed)
Pt called requesting refill of Lovastatin to mail order pharmacy.

## 2012-11-03 ENCOUNTER — Other Ambulatory Visit: Payer: Self-pay | Admitting: *Deleted

## 2012-11-03 MED ORDER — "PEN NEEDLES 5/16"" 31G X 8 MM MISC": 1.0000 | Freq: Every day | Status: DC

## 2012-11-03 MED ORDER — LOVASTATIN 40 MG PO TABS: ORAL_TABLET | ORAL | Status: DC

## 2012-11-08 ENCOUNTER — Other Ambulatory Visit: Payer: Self-pay | Admitting: *Deleted

## 2012-11-08 MED ORDER — "PEN NEEDLES 5/16"" 31G X 8 MM MISC": 1.0000 | Freq: Every day | Status: DC

## 2012-11-08 MED ORDER — LOVASTATIN 40 MG PO TABS: ORAL_TABLET | ORAL | Status: DC

## 2012-11-08 NOTE — Telephone Encounter (Signed)
escript did not go through. rx refill sent.

## 2012-11-11 ENCOUNTER — Other Ambulatory Visit: Payer: Self-pay | Admitting: Endocrinology

## 2012-11-20 IMAGING — CT CT PELVIS W/ CM
3 series · 17 of 46 positions shown, 20 images · IV contrast (Omnipaque 300)
Comparison: None

CLINICAL DATA: Left inguinal pain.

CT PELVIS WITHOUT CONTRAST
TECHNIQUE: Multidetector CT imaging of the pelvis was performed
following the standard protocol without intravenous contrast.

[Series 2: abd/ pel 5mm · axial · 0.65mm/px · z∈[-406,-171]mm · 13 of 53 slices shown, 16 images]
[im 4/53  soft-tissue]
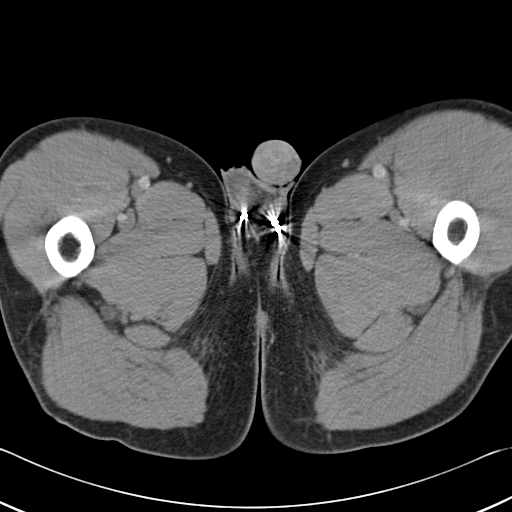
[im 4/53  bone]
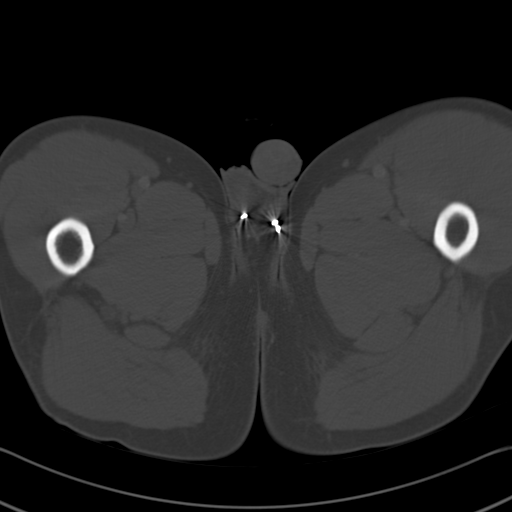
[im 9/53  soft-tissue]
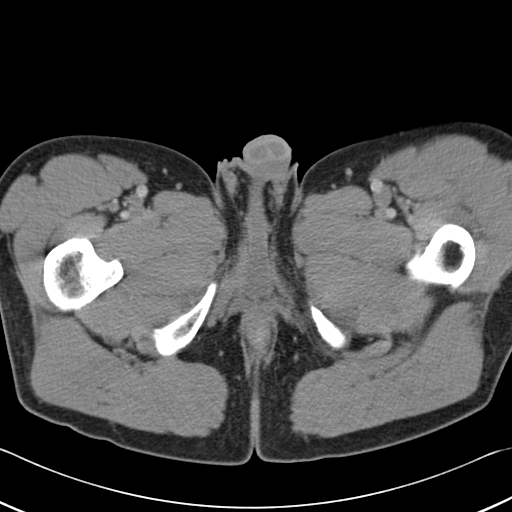
[im 14/53  soft-tissue]
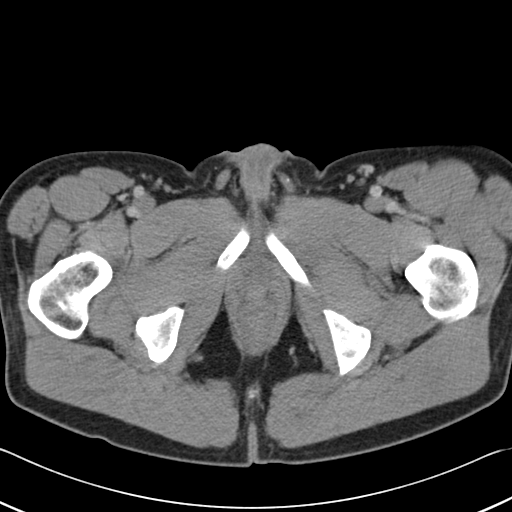
[im 19/53  soft-tissue]
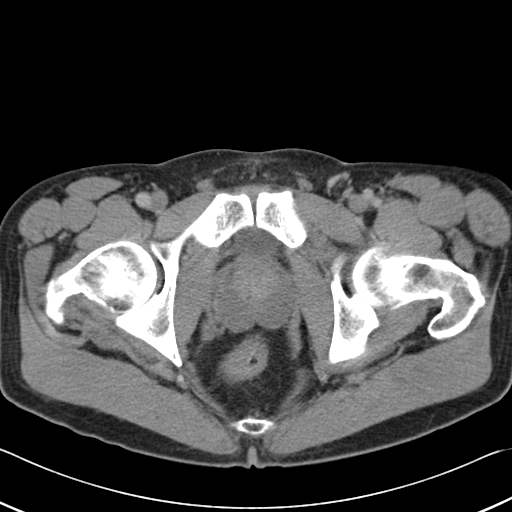
[im 24/53  soft-tissue]
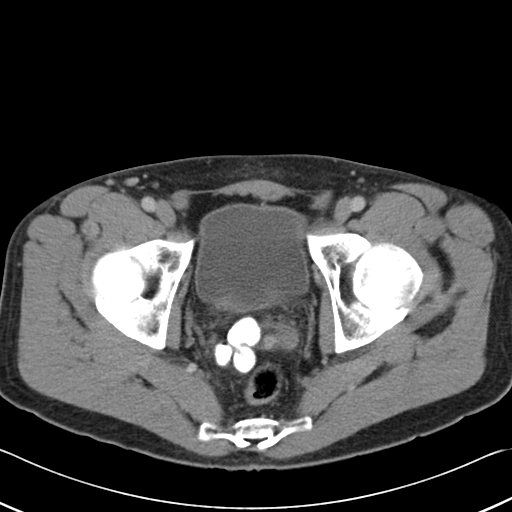
[im 29/53  soft-tissue]
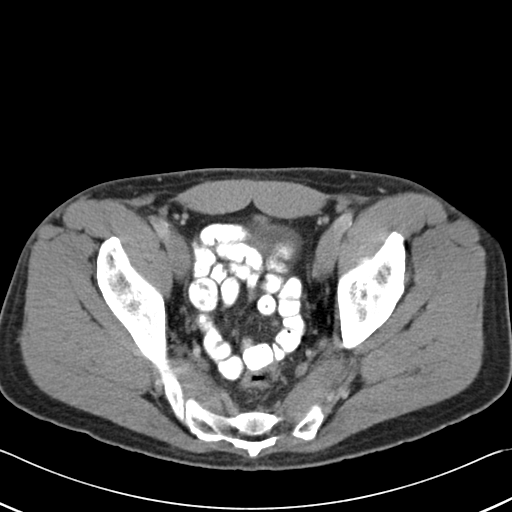
[im 34/53  soft-tissue]
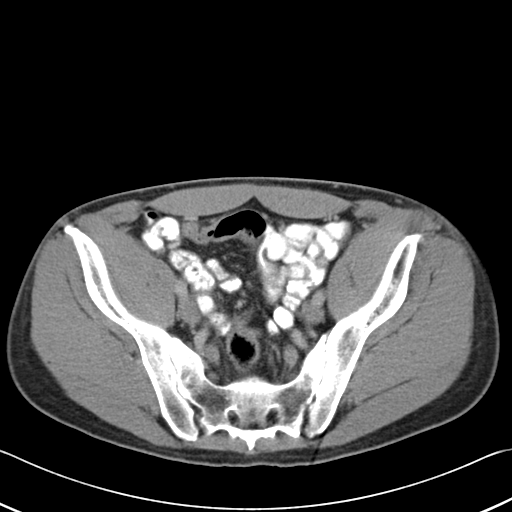
[im 39/53  soft-tissue]
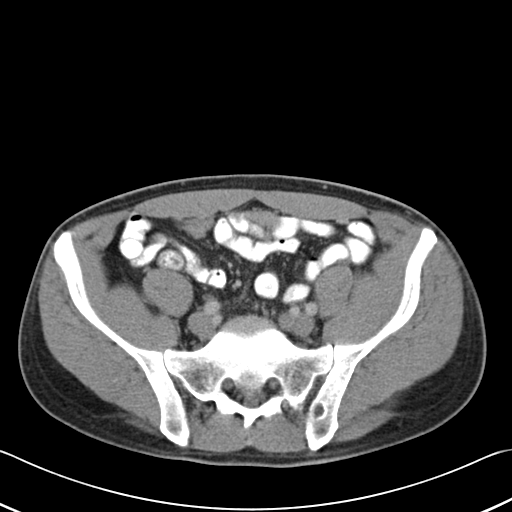
[im 44/53  soft-tissue]
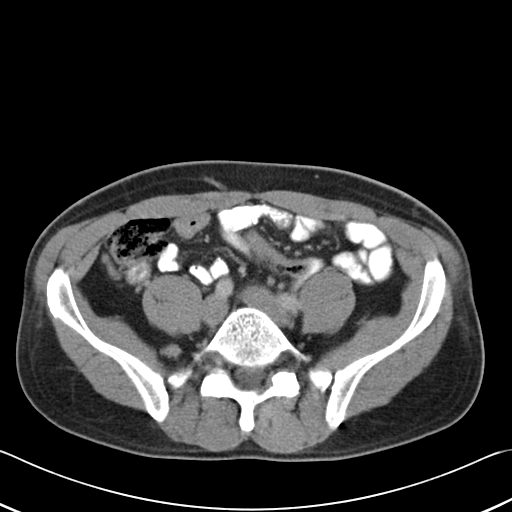
[im 44/53  bone]
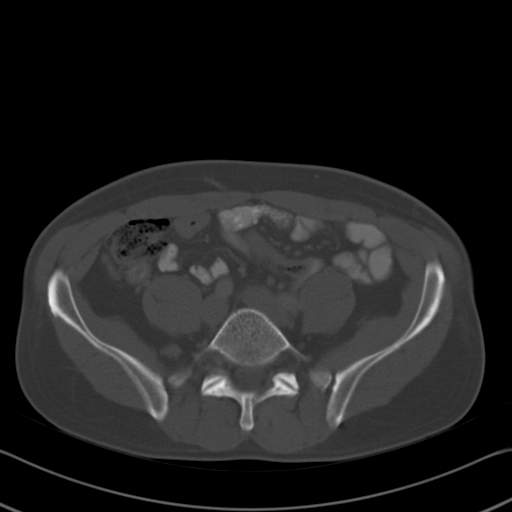
[im 46/53  lung]
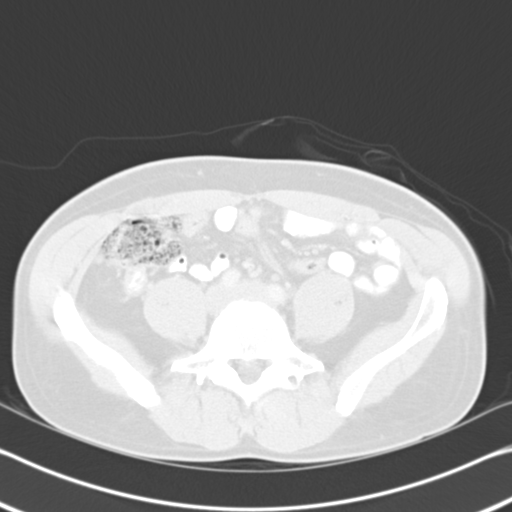
[im 47/53  lung]
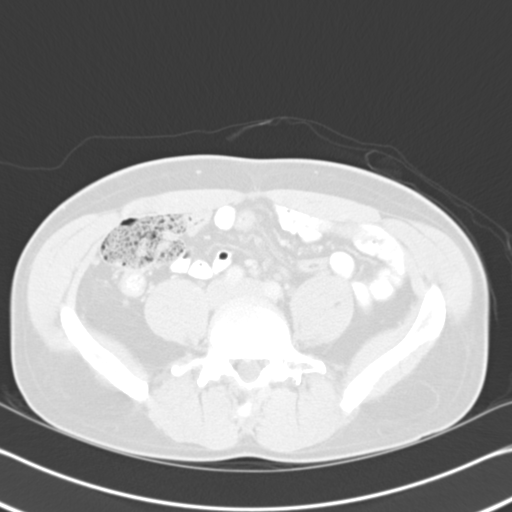
[im 49/53  soft-tissue]
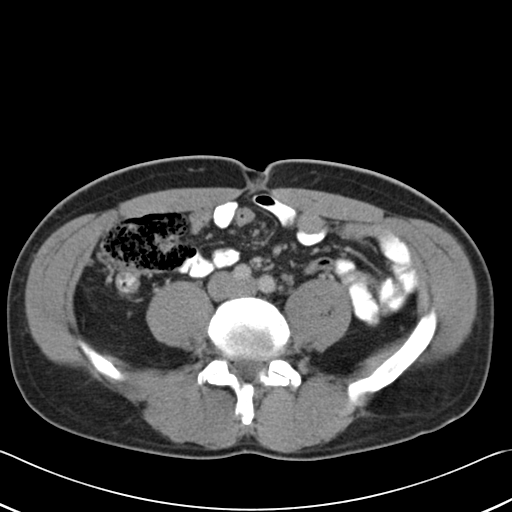
[im 49/53  lung]
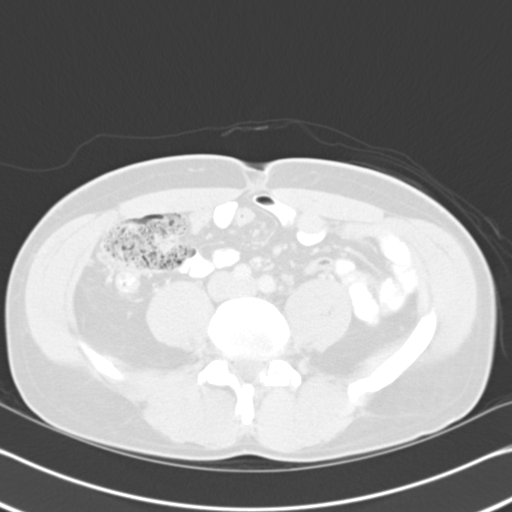
[im 51/53  lung]
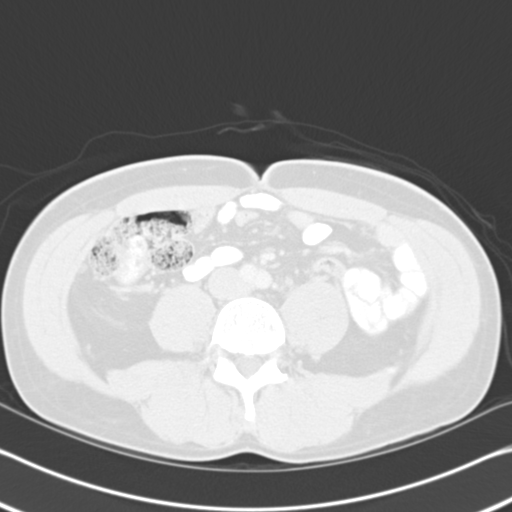

[Series 602: cor · coronal · 0.65mm/px · 3 of 98 slices shown]
[im 33/98  soft-tissue]
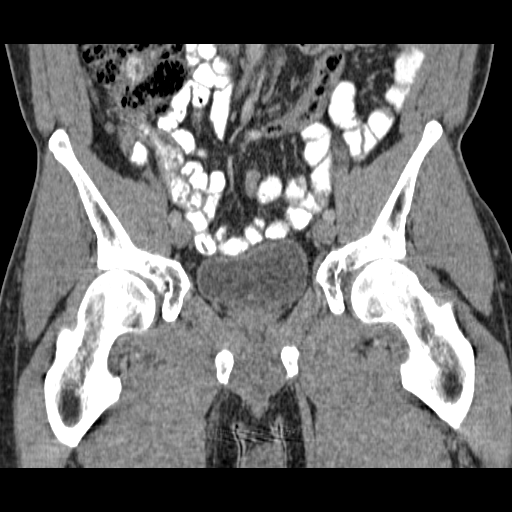
[im 44/98  soft-tissue]
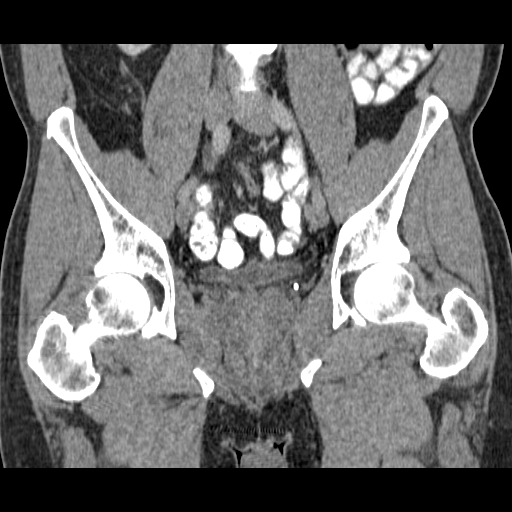
[im 54/98  soft-tissue]
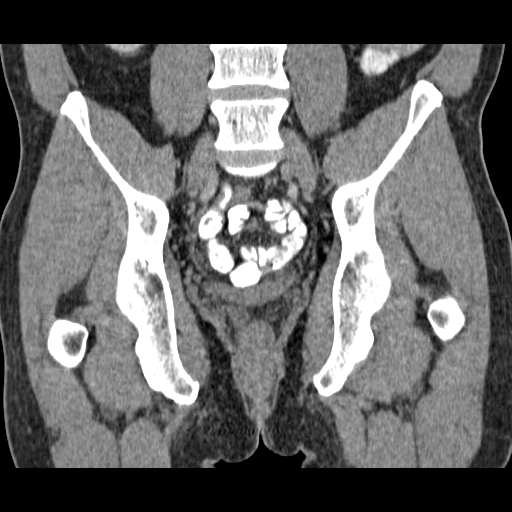

[Series 603: sag · sagittal · 0.65mm/px · 1 of 143 slices shown]
[im 48/143  soft-tissue]
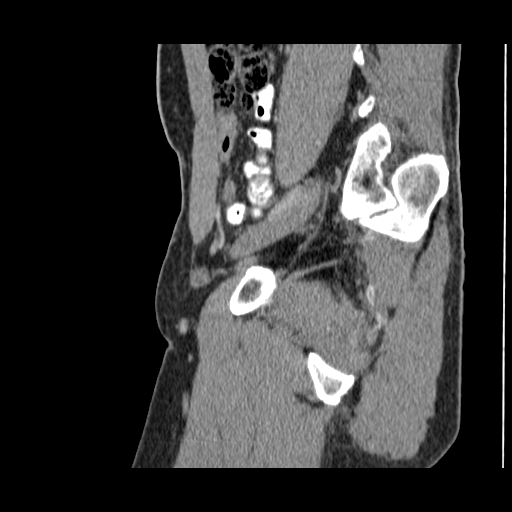

[17 of 46 positions shown; findings below may reference images not displayed]

FINDINGS: The rectum, sigmoid colon and visualized small bowel
loops are unremarkable.  The appendix is normal.  No mesenteric or
pelvic mass or adenopathy.  The bladder is unremarkable.  This
prostate gland and seminal vesicles appear normal.  No inguinal
mass, hernia or lymphadenopathy.

The bony pelvis is intact.  The pubic symphysis and SI joints
appear normal.
IMPRESSION: Unremarkable CT pelvis.

## 2012-11-25 ENCOUNTER — Telehealth: Payer: Self-pay

## 2012-11-25 NOTE — Telephone Encounter (Signed)
Opened in error

## 2012-12-09 ENCOUNTER — Telehealth: Payer: Self-pay | Admitting: *Deleted

## 2012-12-09 ENCOUNTER — Encounter: Payer: Self-pay | Admitting: Endocrinology

## 2012-12-09 ENCOUNTER — Ambulatory Visit (INDEPENDENT_AMBULATORY_CARE_PROVIDER_SITE_OTHER): Payer: 59 | Admitting: Endocrinology

## 2012-12-09 ENCOUNTER — Ambulatory Visit: Payer: 59 | Admitting: Endocrinology

## 2012-12-09 VITALS — BP 126/70 | HR 77 | Ht 70.0 in | Wt 171.0 lb

## 2012-12-09 DIAGNOSIS — E109 Type 1 diabetes mellitus without complications: Secondary | ICD-10-CM

## 2012-12-09 DIAGNOSIS — Z0000 Encounter for general adult medical examination without abnormal findings: Secondary | ICD-10-CM

## 2012-12-09 DIAGNOSIS — E7800 Pure hypercholesterolemia, unspecified: Secondary | ICD-10-CM

## 2012-12-09 DIAGNOSIS — Z125 Encounter for screening for malignant neoplasm of prostate: Secondary | ICD-10-CM

## 2012-12-09 LAB — URINALYSIS, ROUTINE W REFLEX MICROSCOPIC
Hgb urine dipstick: NEGATIVE
RBC / HPF: NONE SEEN (ref 0–?)
Total Protein, Urine: NEGATIVE
Urine Glucose: NEGATIVE

## 2012-12-09 LAB — BASIC METABOLIC PANEL
BUN: 22 mg/dL (ref 6–23)
CO2: 29 mEq/L (ref 19–32)
GFR: 80.46 mL/min (ref >60.00)
Glucose, Bld: 51 mg/dL — ABNORMAL LOW (ref 70–99)
Potassium: 4.4 mEq/L (ref 3.5–5.1)

## 2012-12-09 LAB — MICROALBUMIN / CREATININE URINE RATIO
Creatinine,U: 78.8 mg/dL
Microalb Creat Ratio: 1.6 mg/g (ref 0.0–30.0)
Microalb, Ur: 1.3 mg/dL (ref 0.0–1.9)

## 2012-12-09 LAB — HEPATIC FUNCTION PANEL
ALT: 24 U/L (ref 0–53)
Bilirubin, Direct: 0.1 mg/dL (ref 0.0–0.3)
Total Bilirubin: 0.9 mg/dL (ref 0.3–1.2)

## 2012-12-09 LAB — CBC WITH DIFFERENTIAL/PLATELET
Basophils Relative: 1.1 % (ref 0.0–3.0)
Eosinophils Relative: 3.6 % (ref 0.0–5.0)
HCT: 43.2 % (ref 39.0–52.0)
MCV: 89.7 fl (ref 78.0–100.0)
Monocytes Absolute: 0.8 10*3/uL (ref 0.1–1.0)
Neutrophils Relative %: 49 % (ref 43.0–77.0)
RBC: 4.82 Mil/uL (ref 4.22–5.81)
WBC: 9.1 10*3/uL (ref 4.5–10.5)

## 2012-12-09 LAB — LIPID PANEL
Cholesterol: 138 mg/dL (ref 0–200)
HDL: 74.9 mg/dL (ref >39.00)
Triglycerides: 25 mg/dL (ref 0.0–149.0)

## 2012-12-09 MED ORDER — GLUCOSE BLOOD VI STRP: ORAL_STRIP | Status: DC

## 2012-12-09 MED ORDER — INSULIN NPH (HUMAN) (ISOPHANE) 100 UNIT/ML ~~LOC~~ SUSP: 17.0000 [IU] | Freq: Every day | SUBCUTANEOUS | Status: DC

## 2012-12-09 MED ORDER — INSULIN LISPRO 100 UNIT/ML ~~LOC~~ SOLN: 10.0000 [IU] | Freq: Three times a day (TID) | SUBCUTANEOUS | Status: DC

## 2012-12-09 NOTE — Telephone Encounter (Signed)
Called pt and let him know that his lab results were good, except blood sugar is a little low at the time of blood draw. He asked what his HgbA1c was and I told him 7.1 and he understood.

## 2012-12-09 NOTE — Progress Notes (Signed)
  Subjective:    Patient ID: Randy Wallace, male    DOB: 04-Aug-1955, 57 y.o.   MRN: [id number]  HPI Pt returns for f/u of type 1 DM (dx'ed 2002; no neuropathy of the lower extremities or other associated complications; he declines pump and continuous glucose monitor).  pt states he feels well in general.  He has mild hypoglycemia approx once a week.  This happens after he takes extra insulin for an elevated cbg.   He was struck with a softball at the right ear 5 years ago.  He has slight hearing loss since.   Past Medical History  Diagnosis Date  . DIABETES MELLITUS, TYPE I 02/18/2007  . HYPERCHOLESTEROLEMIA 12/29/2007  . ED (erectile dysfunction)     No past surgical history on file.  History   Social History  . Marital Status: Married    Spouse Name: N/A    Number of Children: 2  . Years of Education: N/A   Occupational History  . Science writer    Social History Main Topics  . Smoking status: Never Smoker   . Smokeless tobacco: Not on file  . Alcohol Use: Yes  . Drug Use:   . Sexually Active:    Other Topics Concern  . Not on file   Social History Narrative  . No narrative on file    Current Outpatient Prescriptions on File Prior to Visit  Medication Sig Dispense Refill  . aspirin 325 MG tablet Take 325 mg by mouth daily.        . fluticasone (VERAMYST) 27.5 MCG/SPRAY nasal spray 2 sprays by Nasal route as needed.       . Insulin Pen Needle (PEN NEEDLES 31GX5/16") 31G X 8 MM MISC 1 each by Does not apply route 5 (five) times daily.  450 each  0  . lovastatin (MEVACOR) 40 MG tablet TAKE 2 TABLETS DAILY  180 tablet  0   No current facility-administered medications on file prior to visit.    No Known Allergies  Family History  Problem Relation Age of Onset  . Leukemia Mother   . Cancer Father     Stomach Cancer    BP 126/70  Pulse 77  Ht 5\' 10"  (1.778 m)  Wt 171 lb (77.565 kg)  BMI 24.54 kg/m2  SpO2 97%    Review of Systems Denies LOC and  weight change    Objective:   Physical Exam VITAL SIGNS:  See vs page GENERAL: no distress right eac and tm are normal   Lab Results  Component Value Date   WBC 9.1 12/09/2012   HGB 14.8 12/09/2012   HCT 43.2 12/09/2012   PLT 293.0 12/09/2012   GLUCOSE 51* 12/09/2012   CHOL 138 12/09/2012   TRIG 25.0 12/09/2012   HDL 74.90 12/09/2012   LDLCALC 58 12/09/2012   ALT 24 12/09/2012   AST 25 12/09/2012   NA 139 12/09/2012   K 4.4 12/09/2012   CL 102 12/09/2012   CREATININE 1.0 12/09/2012   BUN 22 12/09/2012   CO2 29 12/09/2012   TSH 2.17 12/09/2012   PSA 2.13 12/09/2012   HGBA1C 7.1* 12/09/2012   MICROALBUR 1.3 12/09/2012       Assessment & Plan:  DM: therapy limited by lack of cbg's.  i'll do the best i can. 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  Pt declines ref audiol now Dyslipidemia, well-controlled

## 2012-12-09 NOTE — Patient Instructions (Addendum)
good diet and exercise habits significanly improve the control of your diabetes.  please let me know if you wish to be referred to a dietician.  high blood sugar is very risky to your health.  you should see an eye doctor every year.  You are at higher than average risk for pneumonia and hepatitis-B.  You should be vaccinated against both.   controlling your blood pressure and cholesterol drastically reduces the damage diabetes does to your body.  this also applies to quitting smoking.  please discuss these with your doctor.  you should take an aspirin every day, unless you have been advised by a doctor not to. check your blood sugar 7 times a day.  also check if you have symptoms of your blood sugar being too high or too low.  please keep a record of the readings and bring it to your next appointment here.  please call us sooner if your blood sugar goes below 70, or if you have a lot of readings over 200.   blood tests are being requested for you today.  We'll contact you with results.   Please come back for a regular physical appointment in 3 months.

## 2012-12-16 ENCOUNTER — Telehealth: Payer: Self-pay | Admitting: Endocrinology

## 2012-12-16 ENCOUNTER — Other Ambulatory Visit: Payer: Self-pay

## 2012-12-16 MED ORDER — GLUCOSE BLOOD VI STRP: ORAL_STRIP | Status: DC

## 2012-12-16 NOTE — Telephone Encounter (Signed)
Rx filled

## 2012-12-17 ENCOUNTER — Other Ambulatory Visit: Payer: Self-pay | Admitting: *Deleted

## 2012-12-17 MED ORDER — BD ULTRA-FINE LANCETS MISC: Status: DC

## 2012-12-21 ENCOUNTER — Other Ambulatory Visit: Payer: Self-pay | Admitting: *Deleted

## 2012-12-21 MED ORDER — BD ULTRA-FINE LANCETS MISC: Status: AC

## 2012-12-21 NOTE — Telephone Encounter (Signed)
Resending rx for lancets to [facility].

## 2012-12-28 ENCOUNTER — Encounter: Payer: Self-pay | Admitting: Endocrinology

## 2012-12-28 ENCOUNTER — Telehealth: Payer: Self-pay

## 2012-12-28 MED ORDER — "PEN NEEDLES 5/16"" 31G X 8 MM MISC": 1.0000 | Freq: Every day | Status: DC

## 2012-12-28 NOTE — Telephone Encounter (Signed)
Pt left voicemail stating that he discuseed with you that he needed pen needles ultrafine #700 not lancets, instead he received lancets, in order for patient to return lancets he needs a letter sent to [facility] along with a new rx, please advise [phone]

## 2012-12-28 NOTE — Telephone Encounter (Signed)
Pt states he did get test strips but he needs pen needles

## 2012-12-28 NOTE — Telephone Encounter (Signed)
Pt needs a letter from you see previous message, pt states he does not want this many lancets as he already lancets

## 2012-12-28 NOTE — Telephone Encounter (Signed)
Please refill pen needles prn

## 2012-12-28 NOTE — Telephone Encounter (Signed)
The lancets should have come along with your test strips.  You did not need these?

## 2012-12-28 NOTE — Telephone Encounter (Signed)
i printed letter 

## 2012-12-28 NOTE — Telephone Encounter (Signed)
Pt advised and letter mailed to [facility]

## 2013-02-04 ENCOUNTER — Other Ambulatory Visit: Payer: Self-pay | Admitting: Endocrinology

## 2013-02-10 ENCOUNTER — Other Ambulatory Visit: Payer: Self-pay | Admitting: *Deleted

## 2013-02-10 MED ORDER — LOVASTATIN 40 MG PO TABS: ORAL_TABLET | ORAL | Status: DC

## 2013-02-10 MED ORDER — INSULIN LISPRO 100 UNIT/ML (KWIKPEN): PEN_INJECTOR | SUBCUTANEOUS | Status: DC

## 2013-02-22 ENCOUNTER — Other Ambulatory Visit: Payer: Self-pay | Admitting: Endocrinology

## 2013-02-22 MED ORDER — GLUCOSE BLOOD VI STRP: ORAL_STRIP | Status: DC

## 2013-03-01 ENCOUNTER — Other Ambulatory Visit: Payer: Self-pay

## 2013-03-01 ENCOUNTER — Telehealth: Payer: Self-pay

## 2013-03-01 NOTE — Telephone Encounter (Signed)
Pt called stating he will need a letter sent to [facility] to that he can get a refund for lancets, pt states does not need anymore lancets, please advise [phone].

## 2013-03-01 NOTE — Telephone Encounter (Signed)
Pt has over 700 lancets at home and does not needs anymore and is being charged $ 40 for the lancets

## 2013-03-01 NOTE — Telephone Encounter (Signed)
         Randy Wallace, Randy Wallace - 03/01/2013 4:12 PM ','<More Detail >>       Romero Belling, MD       Sent: Tue March 01, 2013 5:09 PM    To: Sharlyne Pacas, CMA                           [facility] Size     Small Medium Large Extra Extra Large             Helyn App Description: 57 year old male  03/01/2013 Telephone Provider: Sharlyne Pacas, CMA  MRN: [id number] Department: [facility]                Call Documentation    Sharlyne Pacas, CMA at 03/01/2013 5:18 PM    Status: Signed             Pt has over 700 lancets at home and does not needs anymore and is being charged $ 40 for the lancets        Romero Belling, MD at 03/01/2013 5:09 PM    Status: Signed             Please give me more information to include in the letter, such as why you need to return.        Sharlyne Pacas, CMA at 03/01/2013 5:03 PM    Status: Signed             Pt called stating he will need a letter sent to [facility] to that he can get a refund for lancets, pt states does not need anymore lancets, please advise [phone].             Encounter MyChart Messages    No messages in this encounter         Routing History    Priority Sent On From To Message Type    03/01/2013 5:09 PM Romero Belling, MD Sharlyne Pacas, CMA Patient Calls    03/01/2013 5:03 PM Sharlyne Pacas, CMA Romero Belling, MD Patient Calls      Created by    Sharlyne Pacas, CMA on 03/01/2013 04:12 PM                           Visit Pharmacy    [facility] - [place], [place] - [address]

## 2013-03-01 NOTE — Telephone Encounter (Signed)
Please give me more information to include in the letter, such as why you need to return.

## 2013-03-02 DIAGNOSIS — Z0279 Encounter for issue of other medical certificate: Secondary | ICD-10-CM

## 2013-03-02 NOTE — Telephone Encounter (Signed)
i printed 

## 2013-03-03 ENCOUNTER — Other Ambulatory Visit: Payer: Self-pay

## 2013-03-03 MED ORDER — GLUCOSE BLOOD VI STRP: ORAL_STRIP | Status: DC

## 2013-03-03 NOTE — Telephone Encounter (Signed)
Left message for pt letter was faxed to [facility] and went in and changed rx to read only test strips, no lancets

## 2013-05-04 ENCOUNTER — Ambulatory Visit (INDEPENDENT_AMBULATORY_CARE_PROVIDER_SITE_OTHER): Payer: 59 | Admitting: Endocrinology

## 2013-05-04 VITALS — BP 126/80 | HR 74 | Ht 60.0 in | Wt 171.0 lb

## 2013-05-04 DIAGNOSIS — E109 Type 1 diabetes mellitus without complications: Secondary | ICD-10-CM

## 2013-05-04 DIAGNOSIS — Z23 Encounter for immunization: Secondary | ICD-10-CM

## 2013-05-04 LAB — HEMOGLOBIN A1C: Hgb A1c MFr Bld: 7.3 % — ABNORMAL HIGH (ref 4.6–6.5)

## 2013-05-04 MED ORDER — ALPRAZOLAM 0.25 MG PO TABS: 0.2500 mg | ORAL_TABLET | Freq: Two times a day (BID) | ORAL | Status: DC | PRN

## 2013-05-04 MED ORDER — CIPROFLOXACIN HCL 500 MG PO TABS: 500.0000 mg | ORAL_TABLET | Freq: Two times a day (BID) | ORAL | Status: DC

## 2013-05-04 MED ORDER — ONDANSETRON HCL 4 MG PO TABS: 4.0000 mg | ORAL_TABLET | Freq: Three times a day (TID) | ORAL | Status: DC | PRN

## 2013-05-04 MED ORDER — FLUTICASONE PROPIONATE 50 MCG/ACT NA SUSP: 2.0000 | Freq: Every day | NASAL | Status: DC

## 2013-05-04 NOTE — Progress Notes (Signed)
Subjective:    Patient ID: Randy Wallace, male    DOB: 02-24-1956, 57 y.o.   MRN: [id number]  HPI Pt returns for f/u of type 1 DM (dx'ed 2002, on routine blood test; no neuropathy of the lower extremities or other associated complications; he declines pump and continuous glucose monitor; he has never had severe hypoglycemia or DKA).  pt states he feels well in general.  He says cbg's are well-controlled.  He will travel to [place] soon.   Past Medical History  Diagnosis Date  . DIABETES MELLITUS, TYPE I 02/18/2007  . HYPERCHOLESTEROLEMIA 12/29/2007  . ED (erectile dysfunction)     No past surgical history on file.  History   Social History  . Marital Status: Married    Spouse Name: N/A    Number of Children: 2  . Years of Education: N/A   Occupational History  . Science writer    Social History Main Topics  . Smoking status: Never Smoker   . Smokeless tobacco: Not on file  . Alcohol Use: Yes  . Drug Use:   . Sexual Activity:    Other Topics Concern  . Not on file   Social History Narrative  . No narrative on file    Current Outpatient Prescriptions on File Prior to Visit  Medication Sig Dispense Refill  . aspirin 325 MG tablet Take 325 mg by mouth daily.        . BD ULTRA-FINE LANCETS lancets Use as instructed  700 each  1  . fluticasone (VERAMYST) 27.5 MCG/SPRAY nasal spray 2 sprays by Nasal route as needed.       Marland Kitchen glucose blood (ONE TOUCH ULTRA TEST) test strip One strip, 7 times a day  600 each  3  . HUMALOG KWIKPEN 100 UNIT/ML SOPN Inject subcutaneously 10  units 3 times daily before  meals  3 pen  6  . insulin lispro (HUMALOG KWIKPEN) 100 UNIT/ML SOPN Inject subcutaneously 10  units 3 times daily before  meals  30 mL  3  . insulin lispro (HUMALOG) 100 UNIT/ML injection Inject 10 Units into the skin 3 (three) times daily before meals.  15 mL  11  . insulin NPH (HUMULIN N,NOVOLIN N) 100 UNIT/ML injection Inject 17 Units into the skin at bedtime.  30 mL  1  .  Insulin Pen Needle (PEN NEEDLES 31GX5/16") 31G X 8 MM MISC 1 each by Does not apply route 5 (five) times daily.  450 each  0  . lovastatin (MEVACOR) 40 MG tablet Take 2 tablets by mouth  daily  180 tablet  1   No current facility-administered medications on file prior to visit.    No Known Allergies  Family History  Problem Relation Age of Onset  . Leukemia Mother   . Cancer Father     Stomach Cancer    BP 126/80  Pulse 74  Ht 5' (1.524 m)  Wt 171 lb (77.565 kg)  BMI 33.4 kg/m2  SpO2 97%  Review of Systems He has anxiety and insomnia with overseas air travel.  He has "post-nasal drip."    Objective:   Physical Exam VITAL SIGNS:  See vs page GENERAL: no distress  Lab Results  Component Value Date   HGBA1C 7.3* 05/04/2013      Assessment & Plan:  DM: This insulin regimen was chosen from multiple options, as it best matches his insulin to his changing requirements throughout the day.  The benefits of glycemic control must be weighed  against the risks of hypoglycemia.  i need cbg info in order to safely adjust insulin Upcoming travel: he needs rxs Allergic rhinitis, recurrent

## 2013-05-04 NOTE — Patient Instructions (Addendum)
Here are several prescriptions (sleep, steroid nasal spray, antibiotic, and anti-nausea). check your blood sugar 7 times a day.  also check if you have symptoms of your blood sugar being too high or too low.  please keep a record of the readings and bring it to your next appointment here.  please call us sooner if your blood sugar goes below 70, or if you have a lot of readings over 200.   blood tests are being requested for you today.  We'll contact you with results.   I would be happy to refer you to a specialist for your foot pain.   Please come back for a regular physical appointment in 3 months.

## 2013-05-05 ENCOUNTER — Telehealth: Payer: Self-pay | Admitting: Endocrinology

## 2013-05-05 MED ORDER — CIPROFLOXACIN HCL 500 MG PO TABS: 500.0000 mg | ORAL_TABLET | Freq: Two times a day (BID) | ORAL | Status: DC

## 2013-05-05 MED ORDER — ONDANSETRON HCL 4 MG PO TABS: 4.0000 mg | ORAL_TABLET | Freq: Three times a day (TID) | ORAL | Status: DC | PRN

## 2013-05-05 MED ORDER — FLUTICASONE PROPIONATE 50 MCG/ACT NA SUSP: 2.0000 | Freq: Every day | NASAL | Status: AC

## 2013-05-05 NOTE — Telephone Encounter (Signed)
ok 

## 2013-05-05 NOTE — Telephone Encounter (Signed)
sent 

## 2013-05-05 NOTE — Telephone Encounter (Signed)
Pharmacy tech, Angie, from [facility] called stating they do not have any tablets of the Zofran in stock, but they do have the ODT in stock. Can they fill his rx with the oral dissolving tablets? [phone]). Please advise.

## 2013-05-06 NOTE — Telephone Encounter (Signed)
Left message

## 2013-05-30 ENCOUNTER — Other Ambulatory Visit: Payer: Self-pay | Admitting: Endocrinology

## 2013-05-31 ENCOUNTER — Other Ambulatory Visit: Payer: Self-pay | Admitting: *Deleted

## 2013-05-31 MED ORDER — "PEN NEEDLES 5/16"" 31G X 8 MM MISC": 1.0000 | Freq: Every day | Status: DC

## 2013-08-24 ENCOUNTER — Ambulatory Visit (INDEPENDENT_AMBULATORY_CARE_PROVIDER_SITE_OTHER): Payer: 59 | Admitting: Endocrinology

## 2013-08-24 ENCOUNTER — Ambulatory Visit
Admission: RE | Admit: 2013-08-24 | Discharge: 2013-08-24 | Disposition: A | Discharge status: Home / Self Care | Payer: 59 | Source: Ambulatory Visit | Attending: Endocrinology | Admitting: Endocrinology

## 2013-08-24 VITALS — BP 120/70 | HR 71 | Temp 98.1°F | Ht 70.0 in | Wt 170.0 lb

## 2013-08-24 DIAGNOSIS — M79671 Pain in right foot: Secondary | ICD-10-CM

## 2013-08-24 DIAGNOSIS — M79609 Pain in unspecified limb: Secondary | ICD-10-CM

## 2013-08-24 LAB — HEMOGLOBIN A1C: Hgb A1c MFr Bld: 7.1 % — ABNORMAL HIGH (ref 4.6–6.5)

## 2013-08-24 NOTE — Patient Instructions (Addendum)
check your blood sugar 7 times a day.  also check if you have symptoms of your blood sugar being too high or too low.  please keep a record of the readings and bring it to your next appointment here.  please call us sooner if your blood sugar goes below 70, or if you have a lot of readings over 200.   blood tests and x-rays are requested for you today.  We'll contact you with results.   Please come back for a regular physical appointment in 3 months.

## 2013-08-24 NOTE — Progress Notes (Signed)
Subjective:    Patient ID: Randy Wallace, male    DOB: 10/14/1955, 58 y.o.   MRN: [id number]  HPI Pt returns for f/u of type 1 DM (dx'ed 2002, on routine blood test; no neuropathy of the lower extremities or other associated complications; he has been on insulin since 2004; he takes multiple daily injections; he declines pump and continuous glucose monitor; he has never had severe hypoglycemia or DKA).  He says cbg's are well-controlled.  He seldom has hypoglycemia, and these episodes are mild. Past Medical History  Diagnosis Date  . DIABETES MELLITUS, TYPE I 02/18/2007  . HYPERCHOLESTEROLEMIA 12/29/2007  . ED (erectile dysfunction)     No past surgical history on file.  History   Social History  . Marital Status: Married    Spouse Name: N/A    Number of Children: 2  . Years of Education: N/A   Occupational History  . Science writerMarketing Consultant    Social History Main Topics  . Smoking status: Never Smoker   . Smokeless tobacco: Not on file  . Alcohol Use: Yes  . Drug Use:   . Sexual Activity:    Other Topics Concern  . Not on file   Social History Narrative  . No narrative on file    Current Outpatient Prescriptions on File Prior to Visit  Medication Sig Dispense Refill  . ALPRAZolam (XANAX) 0.25 MG tablet Take 1 tablet (0.25 mg total) by mouth 2 (two) times daily as needed for sleep.  20 tablet  0  . aspirin 325 MG tablet Take 325 mg by mouth daily.        . BD ULTRA-FINE LANCETS lancets Use as instructed  700 each  1  . ciprofloxacin (CIPRO) 500 MG tablet Take 1 tablet (500 mg total) by mouth 2 (two) times daily. Take if traveler's diarrhea  14 tablet  0  . fluticasone (FLONASE) 50 MCG/ACT nasal spray Place 2 sprays into the nose daily.  16 g  6  . glucose blood (ONE TOUCH ULTRA TEST) test strip One strip, 7 times a day  600 each  3  . HUMALOG KWIKPEN 100 UNIT/ML SOPN Inject subcutaneously 10  units 3 times daily before  meals  3 pen  6  . insulin lispro (HUMALOG  KWIKPEN) 100 UNIT/ML SOPN Inject subcutaneously 10  units 3 times daily before  meals  30 mL  3  . insulin lispro (HUMALOG) 100 UNIT/ML injection Inject 10 Units into the skin 3 (three) times daily before meals.  15 mL  11  . insulin NPH (HUMULIN N,NOVOLIN N) 100 UNIT/ML injection Inject 17 Units into the skin at bedtime.  30 mL  1  . Insulin Pen Needle (PEN NEEDLES 31GX5/16") 31G X 8 MM MISC 1 each by Does not apply route 5 (five) times daily.  450 each  2  . lovastatin (MEVACOR) 40 MG tablet Take 2 tablets by mouth  daily  180 tablet  1  . ondansetron (ZOFRAN) 4 MG tablet Take 1 tablet (4 mg total) by mouth every 8 (eight) hours as needed for nausea.  20 tablet  0   No current facility-administered medications on file prior to visit.    No Known Allergies  Family History  Problem Relation Age of Onset  . Leukemia Mother   . Cancer Father     Stomach Cancer    BP 120/70  Pulse 71  Temp(Src) 98.1 F (36.7 C) (Oral)  Ht 5\' 10"  (1.778 m)  Wt  170 lb (77.111 kg)  BMI 24.39 kg/m2  SpO2 96%  Review of Systems Right heel pain persists.  He has a nodule at the anus.    Objective:   Physical Exam VITAL SIGNS:  See vs page GENERAL: no distress Rectal: < 1 cm ext skin tag.  Lab Results  Component Value Date   HGBA1C 7.1* 08/24/2013      Assessment & Plan:  DM: This insulin regimen was chosen from multiple options, as it best matches his insulin to his changing requirements throughout the day.  The benefits of glycemic control must be weighed against the risks of hypoglycemia.  i need cbg info in order to safely adjust insulin Foot pain, persistent. Skin tag at the anus.  No rx needed.

## 2014-01-25 ENCOUNTER — Other Ambulatory Visit: Payer: Self-pay | Admitting: Endocrinology

## 2014-01-30 ENCOUNTER — Other Ambulatory Visit: Payer: Self-pay | Admitting: Endocrinology

## 2014-02-07 ENCOUNTER — Telehealth: Payer: Self-pay

## 2014-02-07 NOTE — Telephone Encounter (Signed)
LVM for pt to call back and schedule CPE with PCP.   /Diabetic Bundle pt./ 

## 2014-03-14 ENCOUNTER — Other Ambulatory Visit: Payer: Self-pay | Admitting: Endocrinology

## 2014-03-16 ENCOUNTER — Encounter: Payer: Self-pay | Admitting: Endocrinology

## 2014-03-16 ENCOUNTER — Ambulatory Visit (INDEPENDENT_AMBULATORY_CARE_PROVIDER_SITE_OTHER): Payer: PRIVATE HEALTH INSURANCE | Admitting: Endocrinology

## 2014-03-16 VITALS — BP 122/82 | HR 66 | Temp 97.8°F | Ht 70.0 in | Wt 167.0 lb

## 2014-03-16 DIAGNOSIS — Z23 Encounter for immunization: Secondary | ICD-10-CM

## 2014-03-16 DIAGNOSIS — E7800 Pure hypercholesterolemia, unspecified: Secondary | ICD-10-CM

## 2014-03-16 DIAGNOSIS — Z0000 Encounter for general adult medical examination without abnormal findings: Secondary | ICD-10-CM

## 2014-03-16 DIAGNOSIS — M542 Cervicalgia: Secondary | ICD-10-CM

## 2014-03-16 DIAGNOSIS — R972 Elevated prostate specific antigen [PSA]: Secondary | ICD-10-CM

## 2014-03-16 DIAGNOSIS — Z125 Encounter for screening for malignant neoplasm of prostate: Secondary | ICD-10-CM

## 2014-03-16 DIAGNOSIS — E109 Type 1 diabetes mellitus without complications: Secondary | ICD-10-CM

## 2014-03-16 LAB — LIPID PANEL
CHOL/HDL RATIO: 2
Cholesterol: 146 mg/dL (ref 0–200)
HDL: 71.3 mg/dL (ref >39.00)
LDL CALC: 69 mg/dL (ref 0–99)
NONHDL: 74.7
Triglycerides: 28 mg/dL (ref 0.0–149.0)
VLDL: 5.6 mg/dL (ref 0.0–40.0)

## 2014-03-16 LAB — URINALYSIS, ROUTINE W REFLEX MICROSCOPIC
Bilirubin Urine: NEGATIVE
Hgb urine dipstick: NEGATIVE
Leukocytes, UA: NEGATIVE
Nitrite: NEGATIVE
RBC / HPF: NONE SEEN (ref 0–?)
Specific Gravity, Urine: 1.015 (ref 1.000–1.030)
TOTAL PROTEIN, URINE-UPE24: NEGATIVE
UROBILINOGEN UA: 0.2 (ref 0.0–1.0)
Urine Glucose: NEGATIVE
WBC, UA: NONE SEEN (ref 0–?)
pH: 6.5 (ref 5.0–8.0)

## 2014-03-16 LAB — CBC WITH DIFFERENTIAL/PLATELET
BASOS PCT: 1 % (ref 0.0–3.0)
Basophils Absolute: 0.1 10*3/uL (ref 0.0–0.1)
EOS ABS: 0.2 10*3/uL (ref 0.0–0.7)
Eosinophils Relative: 3 % (ref 0.0–5.0)
HCT: 41.8 % (ref 39.0–52.0)
Hemoglobin: 13.8 g/dL (ref 13.0–17.0)
Lymphocytes Relative: 28.5 % (ref 12.0–46.0)
Lymphs Abs: 2.2 10*3/uL (ref 0.7–4.0)
MCHC: 33 g/dL (ref 30.0–36.0)
MCV: 92.3 fl (ref 78.0–100.0)
MONO ABS: 0.5 10*3/uL (ref 0.1–1.0)
Monocytes Relative: 6.6 % (ref 3.0–12.0)
Neutro Abs: 4.7 10*3/uL (ref 1.4–7.7)
Neutrophils Relative %: 60.9 % (ref 43.0–77.0)
Platelets: 281 10*3/uL (ref 150.0–400.0)
RBC: 4.53 Mil/uL (ref 4.22–5.81)
RDW: 12.9 % (ref 11.5–15.5)
WBC: 7.7 10*3/uL (ref 4.0–10.5)

## 2014-03-16 LAB — HEMOGLOBIN A1C: Hgb A1c MFr Bld: 7.1 % — ABNORMAL HIGH (ref 4.6–6.5)

## 2014-03-16 LAB — BASIC METABOLIC PANEL
BUN: 18 mg/dL (ref 6–23)
CALCIUM: 9.8 mg/dL (ref 8.4–10.5)
CHLORIDE: 101 meq/L (ref 96–112)
CO2: 29 meq/L (ref 19–32)
Creatinine, Ser: 0.9 mg/dL (ref 0.4–1.5)
GFR: 98.84 mL/min (ref >60.00)
Glucose, Bld: 64 mg/dL — ABNORMAL LOW (ref 70–99)
Potassium: 4.1 mEq/L (ref 3.5–5.1)
Sodium: 137 mEq/L (ref 135–145)

## 2014-03-16 LAB — MICROALBUMIN / CREATININE URINE RATIO
Creatinine,U: 139.3 mg/dL
Microalb Creat Ratio: 0.2 mg/g (ref 0.0–30.0)
Microalb, Ur: 0.3 mg/dL (ref 0.0–1.9)

## 2014-03-16 LAB — HEPATIC FUNCTION PANEL
ALK PHOS: 51 U/L (ref 39–117)
ALT: 29 U/L (ref 0–53)
AST: 25 U/L (ref 0–37)
Albumin: 4.3 g/dL (ref 3.5–5.2)
BILIRUBIN DIRECT: 0.1 mg/dL (ref 0.0–0.3)
Total Bilirubin: 1 mg/dL (ref 0.2–1.2)
Total Protein: 7.3 g/dL (ref 6.0–8.3)

## 2014-03-16 LAB — TSH: TSH: 2.99 u[IU]/mL (ref 0.35–4.50)

## 2014-03-16 LAB — PSA: PSA: 4.22 ng/mL — ABNORMAL HIGH (ref 0.10–4.00)

## 2014-03-16 NOTE — Patient Instructions (Signed)
please consider these measures for your health:  minimize alcohol.  do not use tobacco products.  have a colonoscopy at least every 10 years from age 58.  keep firearms safely stored.  always use seat belts.  have working smoke alarms in your home.  see an eye doctor and dentist regularly.  never drive under the influence of alcohol or drugs (including prescription drugs).  those with fair skin should take precautions against the sun. blood tests are being requested for you today.  We'll contact you with results.  Please come back for a follow-up appointment in 3 months.   Please avoid injecting into the swollen area of your abdomen, until the swelling goes down.

## 2014-03-16 NOTE — Progress Notes (Signed)
Subjective:    Patient ID: Randy Wallace, male    DOB: 06/24/1956, 58 y.o.   MRN: [id number]  HPI Pt is here for regular wellness examination, and is feeling pretty well in general, and says chronic med probs are stable, except as noted below  Past Medical History  Diagnosis Date  . DIABETES MELLITUS, TYPE I 02/18/2007  . HYPERCHOLESTEROLEMIA 12/29/2007  . ED (erectile dysfunction)     No past surgical history on file.  History   Social History  . Marital Status: Married    Spouse Name: N/A    Number of Children: 2  . Years of Education: N/A   Occupational History  . Science writer    Social History Main Topics  . Smoking status: Never Smoker   . Smokeless tobacco: Not on file  . Alcohol Use: Yes  . Drug Use:   . Sexual Activity:    Other Topics Concern  . Not on file   Social History Narrative  . No narrative on file    Current Outpatient Prescriptions on File Prior to Visit  Medication Sig Dispense Refill  . ALPRAZolam (XANAX) 0.25 MG tablet Take 1 tablet (0.25 mg total) by mouth 2 (two) times daily as needed for sleep.  20 tablet  0  . aspirin 325 MG tablet Take 325 mg by mouth daily.        . B-D ULTRAFINE III SHORT PEN 31G X 8 MM MISC Use 5 times daily.  450 each  2  . BD ULTRA-FINE LANCETS lancets Use as instructed  700 each  1  . fluticasone (FLONASE) 50 MCG/ACT nasal spray Place 2 sprays into the nose daily.  16 g  6  . glucose blood (ONE TOUCH ULTRA TEST) test strip One strip, 7 times a day  600 each  3  . HUMALOG KWIKPEN 100 UNIT/ML KiwkPen Inject subcutaneously 10   units 3 times daily before  meals  30 mL  2  . HUMULIN N KWIKPEN 100 UNIT/ML Kiwkpen Inject subcutaneously 17  units  at bedtime.  30 mL  2  . lovastatin (MEVACOR) 40 MG tablet Take 2 tablets by mouth   daily  180 tablet  0  . ondansetron (ZOFRAN) 4 MG tablet Take 1 tablet (4 mg total) by mouth every 8 (eight) hours as needed for nausea.  20 tablet  0  . insulin lispro (HUMALOG) 100  UNIT/ML injection Inject 10 Units into the skin 3 (three) times daily before meals.  15 mL  11   No current facility-administered medications on file prior to visit.    No Known Allergies  Family History  Problem Relation Age of Onset  . Leukemia Mother   . Cancer Father     Stomach Cancer    BP 122/82  Pulse 66  Temp(Src) 97.8 F (36.6 C) (Oral)  Ht  (1.778 m)  Wt 167 lb (75.751 kg)  BMI 23.96 kg/m2  SpO2 97%    Review of Systems  Constitutional: Negative for fever.  HENT: Negative for hearing loss.   Eyes: Negative for visual disturbance.  Respiratory: Negative for shortness of breath.   Cardiovascular: Negative for chest pain.  Gastrointestinal: Negative for anal bleeding.  Endocrine: Negative for cold intolerance.  Genitourinary: Negative for hematuria.  Musculoskeletal: Negative for back pain.  Skin: Negative for rash.  Allergic/Immunologic: Positive for environmental allergies.  Neurological: Negative for syncope and numbness.  Hematological: Does not bruise/bleed easily.  Psychiatric/Behavioral: Negative for dysphoric mood.  Objective:   Physical Exam VS: see vs page GEN: no distress HEAD: head: no deformity eyes: no periorbital swelling, no proptosis external nose and ears are normal mouth: no lesion seen NECK: supple, thyroid is not enlarged CHEST WALL: no deformity LUNGS: clear to auscultation BREASTS:  No gynecomastia CV: reg rate and rhythm, no murmur ABD: abdomen is soft, nontender.  no hepatosplenomegaly.  not distended.  no hernia.  SKIN:  Insulin injection sites at the anterior abdomen are swollen. RECTAL: normal external and internal exam.  heme neg. MUSCULOSKELETAL: muscle bulk and strength are grossly normal.  no obvious joint swelling.  gait is normal and steady EXTEMITIES: no deformity.  no ulcer on the feet.  feet are of normal color and temp.  no edema PULSES: dorsalis pedis intact bilat.  no carotid bruit NEURO:  cn 2-12  grossly intact.   readily moves all 4's.  sensation is intact to touch on the feet SKIN:  Normal texture and temperature.  No rash or suspicious lesion is visible.   NODES:  None palpable at the neck PSYCH: alert, well-oriented.  Does not appear anxious nor depressed.        Assessment & Plan:  Wellness visit today, with problems stable, except as noted. we discussed code status.  pt requests full code, but would not want to be started or maintained on artificial life-support measures if there was not a reasonable chance of recovery.   please consider these measures for your health:  minimize alcohol.  do not use tobacco products.  have a colonoscopy at least every 10 years from age 40.  Women should have an annual mammogram from age 67.  keep firearms safely stored.  always use seat belts.  have working smoke alarms in your home.  see an eye doctor and dentist regularly.  never drive under the influence of alcohol or drugs (including prescription drugs).  those with fair skin should take precautions against the sun.      SEPARATE EVALUATION FOLLOWS--EACH PROBLEM HERE IS NEW, NOT RESPONDING TO TREATMENT, OR POSES SIGNIFICANT RISK TO THE PATIENT'S HEALTH: HISTORY OF THE PRESENT ILLNESS: Pt returns for f/u of type 1 DM (dx'ed 2002, on routine blood test; no neuropathy of the lower extremities or other associated complications; he has been on insulin since 2004; he takes multiple daily injections; he declines pump and continuous glucose monitor; he has never had pancreatitis, severe hypoglycemia, or DKA).  He says cbg's are well-controlled.  He seldom has hypoglycemia, and these episodes are mild.   Pt states 2 mos of moderate pain at the back of the neck, but no assoc numbness PAST MEDICAL HISTORY reviewed and up to date today REVIEW OF SYSTEMS: Denies decreased urinary stream and weight change PHYSICAL EXAMINATION: VITAL SIGNS:  See vs page GENERAL: no distress Neck: full ROM, but ROM  is painful PROSTATE:  Normal size.  No nodule LAB/XRAY RESULTS: i reviewed electrocardiogram Lab Results  Component Value Date   PSA 4.22* 03/16/2014   PSA 2.13 12/09/2012   PSA 2.77 12/31/2010  IMPRESSION: elev PSA, new Neck pain, mod exacerbation. DM: mild exacerbation.   PLAN:  Ref urol Call if you want to see sports med Try topical pain cream Same rx or DM

## 2014-08-09 ENCOUNTER — Other Ambulatory Visit: Payer: Self-pay | Admitting: Endocrinology

## 2014-10-31 ENCOUNTER — Encounter: Payer: Self-pay | Admitting: Gastroenterology

## 2014-11-15 ENCOUNTER — Encounter: Payer: Self-pay | Admitting: Gastroenterology

## 2014-12-27 ENCOUNTER — Telehealth: Payer: Self-pay | Admitting: Endocrinology

## 2014-12-27 MED ORDER — GLUCOSE BLOOD VI STRP: ORAL_STRIP | Status: DC

## 2014-12-27 NOTE — Telephone Encounter (Signed)
Rx sent to [facility] per request. 

## 2014-12-27 NOTE — Telephone Encounter (Signed)
Correction pt just wants us to call in the test strips to [facility]

## 2014-12-27 NOTE — Telephone Encounter (Signed)
Pt has new insurance and new mail order pharmacy is now [facility] can we please call in rx for all of the meds we rx most important the test strips(almost out) quantity 600  Test 7 times daily

## 2015-01-18 ENCOUNTER — Other Ambulatory Visit: Payer: Self-pay | Admitting: Endocrinology

## 2015-01-18 NOTE — Telephone Encounter (Signed)
Please advise if ok to refill rx's. Last office visit was on 03/16/14. Thanks!

## 2015-02-22 ENCOUNTER — Telehealth: Payer: Self-pay | Admitting: Endocrinology

## 2015-02-22 MED ORDER — INSULIN PEN NEEDLE 31G X 8 MM MISC: Status: DC

## 2015-02-22 NOTE — Telephone Encounter (Signed)
Rx sent. I contacted the pt and left a voicemail advising him to call our office back and make an appointment. Appointment is needed for further refills.

## 2015-02-22 NOTE — Telephone Encounter (Signed)
Patient called and would like a refill   Rx: B-D Ultra Fine needles 31 G  Pharmacy: [facility]    Thank you

## 2015-04-10 ENCOUNTER — Encounter: Payer: Commercial Indemnity | Admitting: Endocrinology

## 2015-04-10 ENCOUNTER — Encounter: Payer: Self-pay | Admitting: Gastroenterology

## 2015-04-17 ENCOUNTER — Ambulatory Visit (INDEPENDENT_AMBULATORY_CARE_PROVIDER_SITE_OTHER): Payer: Commercial Indemnity | Admitting: Endocrinology

## 2015-04-17 ENCOUNTER — Encounter: Payer: Self-pay | Admitting: Endocrinology

## 2015-04-17 ENCOUNTER — Other Ambulatory Visit: Payer: Self-pay

## 2015-04-17 VITALS — BP 118/78 | HR 65 | Temp 98.2°F | Ht 70.0 in | Wt 166.0 lb

## 2015-04-17 DIAGNOSIS — R972 Elevated prostate specific antigen [PSA]: Secondary | ICD-10-CM

## 2015-04-17 DIAGNOSIS — E109 Type 1 diabetes mellitus without complications: Secondary | ICD-10-CM | POA: Diagnosis not present

## 2015-04-17 DIAGNOSIS — Z0000 Encounter for general adult medical examination without abnormal findings: Secondary | ICD-10-CM | POA: Diagnosis not present

## 2015-04-17 DIAGNOSIS — E7800 Pure hypercholesterolemia, unspecified: Secondary | ICD-10-CM

## 2015-04-17 LAB — CBC WITH DIFFERENTIAL/PLATELET
BASOS PCT: 1.3 % (ref 0.0–3.0)
Basophils Absolute: 0.1 10*3/uL (ref 0.0–0.1)
EOS PCT: 6.2 % — AB (ref 0.0–5.0)
Eosinophils Absolute: 0.4 10*3/uL (ref 0.0–0.7)
HCT: 44.1 % (ref 39.0–52.0)
HEMOGLOBIN: 14.6 g/dL (ref 13.0–17.0)
LYMPHS ABS: 2 10*3/uL (ref 0.7–4.0)
Lymphocytes Relative: 32.3 % (ref 12.0–46.0)
MCHC: 33.1 g/dL (ref 30.0–36.0)
MCV: 91.6 fl (ref 78.0–100.0)
MONO ABS: 0.4 10*3/uL (ref 0.1–1.0)
Monocytes Relative: 6.4 % (ref 3.0–12.0)
NEUTROS ABS: 3.3 10*3/uL (ref 1.4–7.7)
Neutrophils Relative %: 53.8 % (ref 43.0–77.0)
Platelets: 266 10*3/uL (ref 150.0–400.0)
RBC: 4.81 Mil/uL (ref 4.22–5.81)
RDW: 13.8 % (ref 11.5–15.5)
WBC: 6.2 10*3/uL (ref 4.0–10.5)

## 2015-04-17 LAB — URINALYSIS, ROUTINE W REFLEX MICROSCOPIC
Bilirubin Urine: NEGATIVE
Hgb urine dipstick: NEGATIVE
Ketones, ur: 15 — AB
Leukocytes, UA: NEGATIVE
Nitrite: NEGATIVE
RBC / HPF: NONE SEEN (ref 0–?)
Specific Gravity, Urine: 1.025 (ref 1.000–1.030)
Total Protein, Urine: NEGATIVE
URINE GLUCOSE: NEGATIVE
Urobilinogen, UA: 1 (ref 0.0–1.0)
WBC UA: NONE SEEN (ref 0–?)
pH: 6 (ref 5.0–8.0)

## 2015-04-17 LAB — HEPATIC FUNCTION PANEL
ALK PHOS: 52 U/L (ref 39–117)
ALT: 18 U/L (ref 0–53)
AST: 16 U/L (ref 0–37)
Albumin: 4.5 g/dL (ref 3.5–5.2)
BILIRUBIN DIRECT: 0.3 mg/dL (ref 0.0–0.3)
Total Bilirubin: 1 mg/dL (ref 0.2–1.2)
Total Protein: 7.1 g/dL (ref 6.0–8.3)

## 2015-04-17 LAB — LIPID PANEL
CHOLESTEROL: 139 mg/dL (ref 0–200)
HDL: 73.5 mg/dL (ref >39.00)
LDL CALC: 54 mg/dL (ref 0–99)
NonHDL: 65.38
TRIGLYCERIDES: 57 mg/dL (ref 0.0–149.0)
Total CHOL/HDL Ratio: 2
VLDL: 11.4 mg/dL (ref 0.0–40.0)

## 2015-04-17 LAB — BASIC METABOLIC PANEL
BUN: 21 mg/dL (ref 6–23)
CALCIUM: 9.6 mg/dL (ref 8.4–10.5)
CO2: 33 mEq/L — ABNORMAL HIGH (ref 19–32)
CREATININE: 1.07 mg/dL (ref 0.40–1.50)
Chloride: 102 mEq/L (ref 96–112)
GFR: 75.5 mL/min (ref >60.00)
GLUCOSE: 170 mg/dL — AB (ref 70–99)
Potassium: 4.6 mEq/L (ref 3.5–5.1)
Sodium: 139 mEq/L (ref 135–145)

## 2015-04-17 LAB — TSH: TSH: 1.64 u[IU]/mL (ref 0.35–4.50)

## 2015-04-17 LAB — MICROALBUMIN / CREATININE URINE RATIO
Creatinine,U: 218.4 mg/dL
MICROALB UR: 0.7 mg/dL (ref 0.0–1.9)
Microalb Creat Ratio: 0.3 mg/g (ref 0.0–30.0)

## 2015-04-17 LAB — PSA: PSA: 3.37 ng/mL (ref 0.10–4.00)

## 2015-04-17 LAB — POCT GLYCOSYLATED HEMOGLOBIN (HGB A1C): Hemoglobin A1C: 6.7

## 2015-04-17 MED ORDER — ALPRAZOLAM 0.25 MG PO TABS: 0.2500 mg | ORAL_TABLET | Freq: Two times a day (BID) | ORAL | Status: DC | PRN

## 2015-04-17 MED ORDER — LOSARTAN POTASSIUM-HCTZ 100-12.5 MG PO TABS: 1.0000 | ORAL_TABLET | Freq: Every day | ORAL | Status: DC

## 2015-04-17 NOTE — Patient Instructions (Signed)
please consider these measures for your health:  minimize alcohol.  do not use tobacco products.  have a colonoscopy at least every 10 years from age 58.  keep firearms safely stored.  always use seat belts.  have working smoke alarms in your home.  see an eye doctor and dentist regularly.  never drive under the influence of alcohol or drugs (including prescription drugs).  those with fair skin should take precautions against the sun. blood tests are requested for you today.  We'll let you know about the results. Please see Bonita Quin today, to go over a continuous glucose monitor. Please come back for a follow-up appointment in 4-5 months. I am always happy to see your continuous glucose monitor data.

## 2015-04-17 NOTE — Progress Notes (Signed)
Subjective:    Patient ID: Randy Wallace, male    DOB: Jun 25, 1956, 59 y.o.   MRN: [id number]  HPI Pt is here for regular wellness examination, and is feeling pretty well in general, and says chronic med probs are stable, except as noted below Past Medical History  Diagnosis Date  . DIABETES MELLITUS, TYPE I 02/18/2007  . HYPERCHOLESTEROLEMIA 12/29/2007  . ED (erectile dysfunction)     No past surgical history on file.  Social History   Social History  . Marital Status: Married    Spouse Name: N/A  . Number of Children: 2  . Years of Education: N/A   Occupational History  . Science writer    Social History Main Topics  . Smoking status: Never Smoker   . Smokeless tobacco: Not on file  . Alcohol Use: Yes  . Drug Use: Not on file  . Sexual Activity: Not on file   Other Topics Concern  . Not on file   Social History Narrative    Current Outpatient Prescriptions on File Prior to Visit  Medication Sig Dispense Refill  . aspirin 325 MG tablet Take 325 mg by mouth daily.      . BD ULTRA-FINE LANCETS lancets Use as instructed 700 each 1  . fluticasone (FLONASE) 50 MCG/ACT nasal spray Place 2 sprays into the nose daily. 16 g 6  . glucose blood (ONE TOUCH ULTRA TEST) test strip Test 7 times daily as  directed 600 each 2  . HUMALOG KWIKPEN 100 UNIT/ML KiwkPen Inject subcutaneously 10  units 3 times daily before  meals 30 mL 11  . HUMULIN N KWIKPEN 100 UNIT/ML Kiwkpen Inject subcutaneously 17  units at bedtime 30 mL 2  . Insulin Pen Needle (B-D ULTRAFINE III SHORT PEN) 31G X 8 MM MISC Use 5 times daily 450 each 0  . lovastatin (MEVACOR) 40 MG tablet Take 2 tablets by mouth  daily 180 tablet 2  . ondansetron (ZOFRAN) 4 MG tablet Take 1 tablet (4 mg total) by mouth every 8 (eight) hours as needed for nausea. 20 tablet 0   No current facility-administered medications on file prior to visit.    No Known Allergies  Family History  Problem Relation Age of Onset  .  Leukemia Mother   . Cancer Father     Stomach Cancer    BP 118/78 mmHg  Pulse 65  Temp(Src) 98.2 F (36.8 C) (Oral)  Ht  (1.778 m)  Wt 166 lb (75.297 kg)  BMI 23.82 kg/m2  SpO2 97%  Review of Systems  Constitutional: Negative for fever.  HENT: Negative for hearing loss.   Eyes: Negative for visual disturbance.  Respiratory: Negative for shortness of breath.   Cardiovascular: Negative for chest pain.  Gastrointestinal: Negative for anal bleeding.  Endocrine: Negative for cold intolerance.  Genitourinary: Negative for hematuria.  Musculoskeletal: Negative for back pain.  Skin: Negative for rash.  Allergic/Immunologic: Positive for environmental allergies.  Neurological: Negative for syncope and numbness.  Hematological: Does not bruise/bleed easily.  Psychiatric/Behavioral: Negative for dysphoric mood.       Objective:   Physical Exam VS: see vs page GEN: no distress HEAD: head: no deformity eyes: no periorbital swelling, no proptosis external nose and ears are normal mouth: no lesion seen NECK: supple, thyroid is not enlarged CHEST WALL: no deformity LUNGS: clear to auscultation BREASTS:  No gynecomastia CV: reg rate and rhythm, no murmur ABD: abdomen is soft, nontender.  no hepatosplenomegaly.  not distended.  no hernia GENITALIA/RECTAL/PROSTATE: sees urology.   MUSCULOSKELETAL: muscle bulk and strength are grossly normal.  no obvious joint swelling.  gait is normal and steady EXTEMITIES: no deformity.  no ulcer on the feet.  feet are of normal color and temp.  no edema PULSES: dorsalis pedis intact bilat.  no carotid bruit NEURO:  cn 2-12 grossly intact.   readily moves all 4's.  sensation is intact to touch on the feet SKIN:  Normal texture and temperature.  No rash or suspicious lesion is visible.   NODES:  None palpable at the neck PSYCH: alert, well-oriented.  Does not appear anxious nor depressed.   i personally reviewed electrocardiogram tracing  (today): Indication: DM Impression: short PR    Assessment & Plan:  Wellness visit today, with problems stable, except as noted. i told pt that short PR is of no consequence to his health.

## 2015-06-18 ENCOUNTER — Other Ambulatory Visit: Payer: Self-pay | Admitting: Endocrinology

## 2015-08-31 ENCOUNTER — Telehealth: Payer: Self-pay | Admitting: Endocrinology

## 2015-08-31 MED ORDER — LOVASTATIN 40 MG PO TABS: ORAL_TABLET | ORAL | Status: DC

## 2015-08-31 MED ORDER — ALPRAZOLAM 0.25 MG PO TABS: 0.2500 mg | ORAL_TABLET | Freq: Two times a day (BID) | ORAL | Status: DC | PRN

## 2015-08-31 MED ORDER — INSULIN ISOPHANE HUMAN 100 UNIT/ML KWIKPEN: PEN_INJECTOR | SUBCUTANEOUS | Status: DC

## 2015-08-31 MED ORDER — INSULIN LISPRO 100 UNIT/ML (KWIKPEN): PEN_INJECTOR | SUBCUTANEOUS | Status: DC

## 2015-08-31 NOTE — Telephone Encounter (Signed)
Rx submitted for the Humalog, Humulin and lovastatin. Please advise if we can refill the pt's xanax. Thanks!

## 2015-08-31 NOTE — Telephone Encounter (Signed)
i printed refill of xanax. He can have all others refilled prn

## 2015-08-31 NOTE — Addendum Note (Signed)
Addended by: Romero Belling on: 08/31/2015 03:53 PM   Modules accepted: Orders

## 2015-08-31 NOTE — Telephone Encounter (Signed)
Rx for xanax refilled and sent to the pt's pharmacy.

## 2015-08-31 NOTE — Telephone Encounter (Signed)
Pt needs refills on humalog pen 30 mL and Humulin pen and lovostatin as well as alprazolam please [facility]

## 2015-09-17 ENCOUNTER — Ambulatory Visit: Payer: Commercial Indemnity | Admitting: Endocrinology

## 2015-09-25 ENCOUNTER — Other Ambulatory Visit: Payer: Self-pay | Admitting: Endocrinology

## 2015-09-28 ENCOUNTER — Ambulatory Visit (INDEPENDENT_AMBULATORY_CARE_PROVIDER_SITE_OTHER): Payer: Commercial Indemnity | Admitting: Endocrinology

## 2015-09-28 ENCOUNTER — Encounter: Payer: Self-pay | Admitting: Endocrinology

## 2015-09-28 VITALS — BP 122/84 | HR 63 | Temp 97.9°F | Ht 70.0 in | Wt 166.0 lb

## 2015-09-28 DIAGNOSIS — E109 Type 1 diabetes mellitus without complications: Secondary | ICD-10-CM | POA: Diagnosis not present

## 2015-09-28 LAB — POCT GLYCOSYLATED HEMOGLOBIN (HGB A1C): Hemoglobin A1C: 7

## 2015-09-28 NOTE — Patient Instructions (Addendum)
blood tests are requested for you today.  We'll let you know about the results.  Please continue the same insulins.   check your blood sugar 8 times a day.  please keep a record of the readings and bring it to your next appointment here (or you can bring the meter itself).  You can write it on any piece of paper.  please call us sooner if your blood sugar goes below 70, or if you have a lot of readings over 200.  Please come back for a regular physical appointment in 6 months (must be after 04/17/15).

## 2015-09-28 NOTE — Progress Notes (Signed)
Subjective:    Patient ID: Randy Wallace, male    DOB: 05/07/1956, 60 y.o.   MRN: [id number]  HPI Pt returns for f/u of diabetes mellitus: DM type: 1 Dx'ed: 2002 Complications: none Therapy: insulin since 2004 DKA: never Severe hypoglycemia: never Pancreatitis: never Other: he takes multiple daily injections; he declines pump and continuous glucose monitor Interval history: pt states he feels well in general, except for a recent URI.  no cbg record, but states cbg's are mildly low approx once per month, and these episodes are mild. Past Medical History  Diagnosis Date  . DIABETES MELLITUS, TYPE I 02/18/2007  . HYPERCHOLESTEROLEMIA 12/29/2007  . ED (erectile dysfunction)     No past surgical history on file.  Social History   Social History  . Marital Status: Married    Spouse Name: N/A  . Number of Children: 2  . Years of Education: N/A   Occupational History  . Science writerMarketing Consultant    Social History Main Topics  . Smoking status: Never Smoker   . Smokeless tobacco: Not on file  . Alcohol Use: Yes  . Drug Use: Not on file  . Sexual Activity: Not on file   Other Topics Concern  . Not on file   Social History Narrative    Current Outpatient Prescriptions on File Prior to Visit  Medication Sig Dispense Refill  . ALPRAZolam (XANAX) 0.25 MG tablet Take 1 tablet (0.25 mg total) by mouth 2 (two) times daily as needed for sleep. 30 tablet 0  . aspirin 325 MG tablet Take 325 mg by mouth daily.      . B-D ULTRAFINE III SHORT PEN 31G X 8 MM MISC USE 5 TIMES DAILY 450 each 2  . BD ULTRA-FINE LANCETS lancets Use as instructed 700 each 1  . fluticasone (FLONASE) 50 MCG/ACT nasal spray Place 2 sprays into the nose daily. 16 g 6  . insulin lispro (HUMALOG KWIKPEN) 100 UNIT/ML KiwkPen Inject subcutaneously 10  units 3 times daily before  meals 30 mL 11  . Insulin NPH, Human,, Isophane, (HUMULIN N KWIKPEN) 100 UNIT/ML Kiwkpen Inject subcutaneously 17  units at bedtime 30 mL 2    . lovastatin (MEVACOR) 40 MG tablet Take 2 tablets by mouth  daily 180 tablet 2  . ondansetron (ZOFRAN) 4 MG tablet Take 1 tablet (4 mg total) by mouth every 8 (eight) hours as needed for nausea. 20 tablet 0  . ONE TOUCH ULTRA TEST test strip TEST 7 TIMES DAILY AS DIRECTED 600 each 1   No current facility-administered medications on file prior to visit.    No Known Allergies  Family History  Problem Relation Age of Onset  . Leukemia Mother   . Cancer Father     Stomach Cancer    BP 122/84 mmHg  Pulse 63  Temp(Src) 97.9 F (36.6 C) (Oral)  Ht 5\' 10"  (1.778 m)  Wt 166 lb (75.297 kg)  BMI 23.82 kg/m2  SpO2 97%   Review of Systems He denies LOC    Objective:   Physical Exam VITAL SIGNS:  See vs page GENERAL: no distress Pulses: dorsalis pedis intact bilat.   MSK: no deformity of the feet CV: no leg edema Skin:  no ulcer on the feet.  normal color and temp on the feet.   Neuro: sensation is intact to touch on the feet.     Lab Results  Component Value Date   HGBA1C 7.0 09/28/2015      Assessment &  Plan:  DM: well-controlled.   Patient is advised the following: Patient Instructions  blood tests are requested for you today.  We'll let you know about the results.  Please continue the same insulins.   check your blood sugar 8 times a day.  please keep a record of the readings and bring it to your next appointment here (or you can bring the meter itself).  You can write it on any piece of paper.  please call us sooner if your blood sugar goes below 70, or if you have a lot of readings over 200.  Please come back for a regular physical appointment in 6 months (must be after 04/17/15).

## 2015-10-18 ENCOUNTER — Telehealth: Payer: Self-pay

## 2015-10-18 NOTE — Telephone Encounter (Signed)
lmom for patient regarding flu shot survey/call 

## 2016-03-03 ENCOUNTER — Other Ambulatory Visit: Payer: Self-pay | Admitting: Endocrinology

## 2016-04-11 ENCOUNTER — Other Ambulatory Visit: Payer: Self-pay | Admitting: Endocrinology

## 2016-04-23 ENCOUNTER — Ambulatory Visit (INDEPENDENT_AMBULATORY_CARE_PROVIDER_SITE_OTHER): Payer: Commercial Indemnity | Admitting: Endocrinology

## 2016-04-23 ENCOUNTER — Encounter: Payer: Self-pay | Admitting: Endocrinology

## 2016-04-23 VITALS — BP 104/78 | HR 60 | Wt 169.0 lb

## 2016-04-23 DIAGNOSIS — E109 Type 1 diabetes mellitus without complications: Secondary | ICD-10-CM | POA: Diagnosis not present

## 2016-04-23 DIAGNOSIS — Z23 Encounter for immunization: Secondary | ICD-10-CM

## 2016-04-23 DIAGNOSIS — Z0000 Encounter for general adult medical examination without abnormal findings: Secondary | ICD-10-CM

## 2016-04-23 LAB — BASIC METABOLIC PANEL
BUN: 17 mg/dL (ref 6–23)
CALCIUM: 9.6 mg/dL (ref 8.4–10.5)
CHLORIDE: 100 meq/L (ref 96–112)
CO2: 32 mEq/L (ref 19–32)
CREATININE: 0.96 mg/dL (ref 0.40–1.50)
GFR: 85.26 mL/min (ref >60.00)
Glucose, Bld: 174 mg/dL — ABNORMAL HIGH (ref 70–99)
Potassium: 4.8 mEq/L (ref 3.5–5.1)
Sodium: 138 mEq/L (ref 135–145)

## 2016-04-23 LAB — CBC WITH DIFFERENTIAL/PLATELET
BASOS ABS: 0 10*3/uL (ref 0.0–0.1)
Basophils Relative: 0.4 % (ref 0.0–3.0)
Eosinophils Absolute: 0.2 10*3/uL (ref 0.0–0.7)
Eosinophils Relative: 3.4 % (ref 0.0–5.0)
HEMATOCRIT: 43 % (ref 39.0–52.0)
Hemoglobin: 14.5 g/dL (ref 13.0–17.0)
LYMPHS PCT: 27.9 % (ref 12.0–46.0)
Lymphs Abs: 1.7 10*3/uL (ref 0.7–4.0)
MCHC: 33.6 g/dL (ref 30.0–36.0)
MCV: 90.4 fl (ref 78.0–100.0)
MONOS PCT: 6.3 % (ref 3.0–12.0)
Monocytes Absolute: 0.4 10*3/uL (ref 0.1–1.0)
NEUTROS ABS: 3.8 10*3/uL (ref 1.4–7.7)
Neutrophils Relative %: 62 % (ref 43.0–77.0)
Platelets: 274 10*3/uL (ref 150.0–400.0)
RBC: 4.76 Mil/uL (ref 4.22–5.81)
RDW: 13.6 % (ref 11.5–15.5)
WBC: 6.1 10*3/uL (ref 4.0–10.5)

## 2016-04-23 LAB — HEPATIC FUNCTION PANEL
ALBUMIN: 4.3 g/dL (ref 3.5–5.2)
ALT: 17 U/L (ref 0–53)
AST: 17 U/L (ref 0–37)
Alkaline Phosphatase: 48 U/L (ref 39–117)
BILIRUBIN DIRECT: 0.2 mg/dL (ref 0.0–0.3)
TOTAL PROTEIN: 7.3 g/dL (ref 6.0–8.3)
Total Bilirubin: 0.8 mg/dL (ref 0.2–1.2)

## 2016-04-23 LAB — URINALYSIS, ROUTINE W REFLEX MICROSCOPIC
Bilirubin Urine: NEGATIVE
HGB URINE DIPSTICK: NEGATIVE
Ketones, ur: NEGATIVE
LEUKOCYTES UA: NEGATIVE
NITRITE: NEGATIVE
RBC / HPF: NONE SEEN (ref 0–?)
Specific Gravity, Urine: 1.01 (ref 1.000–1.030)
Total Protein, Urine: NEGATIVE
Urine Glucose: 100 — AB
Urobilinogen, UA: 0.2 (ref 0.0–1.0)
pH: 6.5 (ref 5.0–8.0)

## 2016-04-23 LAB — MICROALBUMIN / CREATININE URINE RATIO
Creatinine,U: 70.3 mg/dL
Microalb Creat Ratio: 1 mg/g (ref 0.0–30.0)
Microalb, Ur: <0.7 mg/dL (ref 0.0–1.9)

## 2016-04-23 LAB — LIPID PANEL
CHOLESTEROL: 137 mg/dL (ref 0–200)
HDL: 76.4 mg/dL (ref >39.00)
LDL Cholesterol: 54 mg/dL (ref 0–99)
NonHDL: 60.99
Total CHOL/HDL Ratio: 2
Triglycerides: 33 mg/dL (ref 0.0–149.0)
VLDL: 6.6 mg/dL (ref 0.0–40.0)

## 2016-04-23 LAB — PSA: PSA: 3.8 ng/mL (ref 0.10–4.00)

## 2016-04-23 LAB — POCT GLYCOSYLATED HEMOGLOBIN (HGB A1C): Hemoglobin A1C: 6.9

## 2016-04-23 LAB — TSH: TSH: 2.7 u[IU]/mL (ref 0.35–4.50)

## 2016-04-23 MED ORDER — ALPRAZOLAM 0.25 MG PO TABS: 0.2500 mg | ORAL_TABLET | Freq: Two times a day (BID) | ORAL | 0 refills | Status: DC | PRN

## 2016-04-23 NOTE — Progress Notes (Signed)
Subjective:    Patient ID: Randy Wallace, male    DOB: [birth year], 60 y.o.   MRN: [id number]  HPI Pt is here for regular wellness examination, and is feeling pretty well in general, and says chronic med probs are stable, except as noted below Past Medical History:  Diagnosis Date  . DIABETES MELLITUS, TYPE I 02/18/2007  . ED (erectile dysfunction)   . HYPERCHOLESTEROLEMIA 12/29/2007    No past surgical history on file.  Social History   Social History  . Marital status: Married    Spouse name: N/A  . Number of children: 2  . Years of education: N/A   Occupational History  . Science writer    Social History Main Topics  . Smoking status: Never Smoker  . Smokeless tobacco: Not on file  . Alcohol use Yes  . Drug use: Unknown  . Sexual activity: Not on file   Other Topics Concern  . Not on file   Social History Narrative  . No narrative on file    Current Outpatient Prescriptions on File Prior to Visit  Medication Sig Dispense Refill  . aspirin 325 MG tablet Take 325 mg by mouth daily.      . B-D ULTRAFINE III SHORT PEN 31G X 8 MM MISC USE 5 TIMES DAILY 450 each 2  . BD ULTRA-FINE LANCETS lancets Use as instructed 700 each 1  . fluticasone (FLONASE) 50 MCG/ACT nasal spray Place 2 sprays into the nose daily. 16 g 6  . insulin lispro (HUMALOG KWIKPEN) 100 UNIT/ML KiwkPen Inject subcutaneously 10  units 3 times daily before  meals 30 mL 11  . Insulin NPH, Human,, Isophane, (HUMULIN N KWIKPEN) 100 UNIT/ML Kiwkpen Inject subcutaneously 17  units at bedtime 30 mL 2  . lovastatin (MEVACOR) 40 MG tablet Take 2 tablets by mouth  daily 180 tablet 2  . ondansetron (ZOFRAN) 4 MG tablet Take 1 tablet (4 mg total) by mouth every 8 (eight) hours as needed for nausea. 20 tablet 0  . ONE TOUCH ULTRA TEST test strip TEST 7 TIMES DAILY AS DIRECTED 600 each 0   No current facility-administered medications on file prior to visit.     No Known Allergies  Family History  Problem  Relation Age of Onset  . Leukemia Mother   . Cancer Father     Stomach Cancer    BP 104/78   Pulse 60   Wt 169 lb (76.7 kg)   SpO2 98%   BMI 24.25 kg/m    Review of Systems  Constitutional: Negative for fever and unexpected weight change.  HENT: Negative for hearing loss.   Eyes: Negative for visual disturbance.  Respiratory: Negative for shortness of breath.   Cardiovascular: Negative for chest pain.  Gastrointestinal: Negative for blood in stool.  Endocrine: Negative for cold intolerance.       Mild hypoglycemia once per month.  Genitourinary: Negative for hematuria.  Musculoskeletal: Negative for back pain.  Skin: Negative for rash.  Allergic/Immunologic: Positive for environmental allergies.  Neurological: Negative for syncope and numbness.  Hematological: Does not bruise/bleed easily.  Psychiatric/Behavioral: Negative for dysphoric mood.      Objective:   Physical Exam VS: see vs page GEN: no distress HEAD: head: no deformity eyes: no periorbital swelling, no proptosis external nose and ears are normal mouth: no lesion seen NECK: supple, thyroid is not enlarged CHEST WALL: no deformity LUNGS: clear to auscultation.  BREASTS:  No gynecomastia.   CV: reg rate and  rhythm, no murmur.   ABD: abdomen is soft, nontender.  no hepatosplenomegaly.  not distended.  no hernia.  RECTAL/PROSTATE: sees urology.   MUSCULOSKELETAL: muscle bulk and strength are grossly normal.  no obvious joint swelling.  gait is normal and steady.  Flexion contractures of the left hand.   EXTEMITIES: no deformity.  no ulcer on the feet.  feet are of normal color and temp.  no edema.   PULSES: dorsalis pedis intact bilat.  no carotid bruit.   NEURO:  cn 2-12 grossly intact.   readily moves all 4's.  sensation is intact to touch on the feet.   SKIN:  Normal texture and temperature.  No rash or suspicious lesion is visible.   NODES:  None palpable at the neck.   PSYCH: alert, well-oriented.   Does not appear anxious nor depressed.    i personally reviewed electrocardiogram tracing (today):  Indication: DM Impression: normal.    Assessment & Plan:  Wellness visit today, with problems stable, except as noted.

## 2016-04-23 NOTE — Patient Instructions (Addendum)
Please consider these measures for your health:  minimize alcohol.  Do not use tobacco products.  Have a colonoscopy at least every 10 years from age 59.  Women should have an annual mammogram from age 40.  Keep firearms safely stored.  Always use seat belts.  have working smoke alarms in your home.  See an eye doctor and dentist regularly.  Never drive under the influence of alcohol or drugs (including prescription drugs).  Those with fair skin should take precautions against the sun, and should carefully examine their skin once per month, for any new or changed moles. blood tests are requested for you today.  We'll let you know about the results.   Please come back for a follow-up appointment in 6 months. 

## 2016-04-24 LAB — HIV ANTIBODY (ROUTINE TESTING W REFLEX): HIV: NONREACTIVE

## 2016-04-24 LAB — HEPATITIS C ANTIBODY: HCV Ab: NEGATIVE

## 2016-06-06 ENCOUNTER — Other Ambulatory Visit: Payer: Self-pay | Admitting: Endocrinology

## 2016-06-16 ENCOUNTER — Telehealth: Payer: Self-pay | Admitting: Endocrinology

## 2016-06-16 NOTE — Telephone Encounter (Signed)
Patient ask if you could send his PSA lab results to [facility].

## 2016-06-16 NOTE — Telephone Encounter (Signed)
PSA results faxed to [facility].

## 2016-06-30 LAB — COLOGUARD: Cologuard: NEGATIVE

## 2016-07-25 ENCOUNTER — Telehealth: Payer: Self-pay

## 2016-07-25 NOTE — Telephone Encounter (Signed)
I contacted the patient and advised Dr. Everardo AllEllison had received the Cologuard result and the result was normal. Patient voiced understanding and results submitted to scanning.

## 2016-08-13 ENCOUNTER — Other Ambulatory Visit: Payer: Self-pay | Admitting: Endocrinology

## 2016-10-25 ENCOUNTER — Other Ambulatory Visit: Payer: Self-pay | Admitting: Endocrinology

## 2016-10-27 ENCOUNTER — Ambulatory Visit (INDEPENDENT_AMBULATORY_CARE_PROVIDER_SITE_OTHER): Payer: 59 | Admitting: Endocrinology

## 2016-10-27 ENCOUNTER — Encounter: Payer: Self-pay | Admitting: Endocrinology

## 2016-10-27 VITALS — BP 122/82 | HR 71 | Ht 70.0 in | Wt 176.0 lb

## 2016-10-27 DIAGNOSIS — E109 Type 1 diabetes mellitus without complications: Secondary | ICD-10-CM

## 2016-10-27 LAB — POCT GLYCOSYLATED HEMOGLOBIN (HGB A1C): Hemoglobin A1C: 6.9

## 2016-10-27 MED ORDER — ALPRAZOLAM 0.25 MG PO TABS: 0.2500 mg | ORAL_TABLET | Freq: Two times a day (BID) | ORAL | 0 refills | Status: DC | PRN

## 2016-10-27 NOTE — Patient Instructions (Addendum)
Please come back for a regular physical appointment in 6 months (must be after 04/28/17).   Please continue the same insulins.   Here is a refill of the alprazolam.   check your blood sugar 8 times a day.  please keep a record of the readings and bring it to your next appointment here (or you can bring the meter itself).  You can write it on any piece of paper.  please call us sooner if your blood sugar goes below 70, or if you have a lot of readings over 200.

## 2016-10-27 NOTE — Progress Notes (Signed)
Subjective:    Patient ID: Randy Wallace, male    DOB: 07/30/1955, 61 y.o.   MRN: [id number]  HPI Pt returns for f/u of diabetes mellitus: DM type: 1 Dx'ed: 2002 Complications: none Therapy: insulin since 2004 DKA: never Severe hypoglycemia: never Pancreatitis: never Other: he takes multiple daily injections; he declines pump and continuous glucose monitor.   Interval history: pt states he feels well in general.  no cbg record, but states cbg's are mildly low approx twice per month, and these episodes are mild.  Past Medical History:  Diagnosis Date  . DIABETES MELLITUS, TYPE I 02/18/2007  . ED (erectile dysfunction)   . HYPERCHOLESTEROLEMIA 12/29/2007    No past surgical history on file.  Social History   Social History  . Marital status: Married    Spouse name: N/A  . Number of children: 2  . Years of education: N/A   Occupational History  . Science writer    Social History Main Topics  . Smoking status: Never Smoker  . Smokeless tobacco: Never Used  . Alcohol use Yes  . Drug use: Unknown  . Sexual activity: Not on file   Other Topics Concern  . Not on file   Social History Narrative  . No narrative on file    Current Outpatient Prescriptions on File Prior to Visit  Medication Sig Dispense Refill  . aspirin 325 MG tablet Take 325 mg by mouth daily.      . B-D ULTRAFINE III SHORT PEN 31G X 8 MM MISC USE 5 TIMES DAILY 450 each 2  . BD ULTRA-FINE LANCETS lancets Use as instructed 700 each 1  . fluticasone (FLONASE) 50 MCG/ACT nasal spray Place 2 sprays into the nose daily. 16 g 6  . HUMALOG KWIKPEN 100 UNIT/ML KiwkPen INJECT 10 UNITS UNDER THE SKIN THREE TIMES A DAY BEFORE MEALS 30 mL 11  . Insulin NPH, Human,, Isophane, (HUMULIN N KWIKPEN) 100 UNIT/ML Kiwkpen Inject subcutaneously 17  units at bedtime 30 mL 2  . lovastatin (MEVACOR) 40 MG tablet Take 2 tablets by mouth  daily 180 tablet 2  . ONE TOUCH ULTRA TEST test strip TEST 7 TIMES DAILY AS  DIRECTED 600 each 0   No current facility-administered medications on file prior to visit.     No Known Allergies  Family History  Problem Relation Age of Onset  . Leukemia Mother   . Cancer Father     Stomach Cancer    BP 122/82   Pulse 71   Ht  (1.778 m)   Wt 176 lb (79.8 kg)   SpO2 97%   BMI 25.25 kg/m    Review of Systems He denies LOC.  He seldom needs xanax to sleep, and it works well.      Objective:   Physical Exam VITAL SIGNS:  See vs page GENERAL: no distress Pulses: dorsalis pedis intact bilat.   MSK: no deformity of the feet CV: no leg edema Skin:  no ulcer on the feet.  normal color and temp on the feet. Neuro: sensation is intact to touch on the feet.   a1c=6.9%     Assessment & Plan:  Type 1 DM: well-controlled Insomnia, intermittent: well-controlled  Patient Instructions  Please come back for a regular physical appointment in 6 months (must be after 04/28/17).   Please continue the same insulins.   Here is a refill of the alprazolam.   check your blood sugar 8 times a day.  please keep  a record of the readings and bring it to your next appointment here (or you can bring the meter itself).  You can write it on any piece of paper.  please call us sooner if your blood sugar goes below 70, or if you have a lot of readings over 200.

## 2016-12-07 ENCOUNTER — Other Ambulatory Visit: Payer: Self-pay | Admitting: Endocrinology

## 2016-12-10 ENCOUNTER — Other Ambulatory Visit: Payer: Self-pay | Admitting: Endocrinology

## 2017-02-28 ENCOUNTER — Other Ambulatory Visit: Payer: Self-pay | Admitting: Endocrinology

## 2017-05-09 ENCOUNTER — Other Ambulatory Visit: Payer: Self-pay | Admitting: Endocrinology

## 2017-06-15 ENCOUNTER — Telehealth: Payer: Self-pay | Admitting: Endocrinology

## 2017-06-15 NOTE — Telephone Encounter (Signed)
Tried to call patient to set up an appt but his VM box was full.

## 2017-06-15 NOTE — Telephone Encounter (Signed)
Patient needs an Xray of his finger. Please call him at ph# [phone]

## 2017-06-15 NOTE — Telephone Encounter (Signed)
I would need to see pt in order to consider this.

## 2017-06-16 NOTE — Telephone Encounter (Signed)
Patient has appt Friday at 8am for a physical, so he said that issue could be addressed then. Just an FYI.

## 2017-06-19 ENCOUNTER — Ambulatory Visit (INDEPENDENT_AMBULATORY_CARE_PROVIDER_SITE_OTHER): Payer: 59 | Admitting: Endocrinology

## 2017-06-19 ENCOUNTER — Other Ambulatory Visit: Payer: Self-pay

## 2017-06-19 ENCOUNTER — Ambulatory Visit
Admission: RE | Admit: 2017-06-19 | Discharge: 2017-06-19 | Disposition: A | Discharge status: Home / Self Care | Payer: 59 | Source: Ambulatory Visit | Attending: Endocrinology | Admitting: Endocrinology

## 2017-06-19 ENCOUNTER — Encounter: Payer: Self-pay | Admitting: Endocrinology

## 2017-06-19 VITALS — BP 122/80 | HR 80 | Wt 167.4 lb

## 2017-06-19 DIAGNOSIS — E109 Type 1 diabetes mellitus without complications: Secondary | ICD-10-CM

## 2017-06-19 DIAGNOSIS — Z23 Encounter for immunization: Secondary | ICD-10-CM | POA: Diagnosis not present

## 2017-06-19 DIAGNOSIS — R972 Elevated prostate specific antigen [PSA]: Secondary | ICD-10-CM

## 2017-06-19 DIAGNOSIS — M79645 Pain in left finger(s): Secondary | ICD-10-CM

## 2017-06-19 DIAGNOSIS — Z0001 Encounter for general adult medical examination with abnormal findings: Secondary | ICD-10-CM | POA: Diagnosis not present

## 2017-06-19 DIAGNOSIS — Z0000 Encounter for general adult medical examination without abnormal findings: Secondary | ICD-10-CM

## 2017-06-19 DIAGNOSIS — M79646 Pain in unspecified finger(s): Secondary | ICD-10-CM | POA: Insufficient documentation

## 2017-06-19 LAB — HEPATIC FUNCTION PANEL
ALBUMIN: 4.4 g/dL (ref 3.5–5.2)
ALK PHOS: 49 U/L (ref 39–117)
ALT: 23 U/L (ref 0–53)
AST: 21 U/L (ref 0–37)
BILIRUBIN DIRECT: 0.2 mg/dL (ref 0.0–0.3)
TOTAL PROTEIN: 7.1 g/dL (ref 6.0–8.3)
Total Bilirubin: 1.1 mg/dL (ref 0.2–1.2)

## 2017-06-19 LAB — URINALYSIS, ROUTINE W REFLEX MICROSCOPIC
BILIRUBIN URINE: NEGATIVE
Hgb urine dipstick: NEGATIVE
KETONES UR: 15 — AB
Leukocytes, UA: NEGATIVE
Nitrite: NEGATIVE
PH: 6 (ref 5.0–8.0)
RBC / HPF: NONE SEEN (ref 0–?)
SPECIFIC GRAVITY, URINE: 1.025 (ref 1.000–1.030)
TOTAL PROTEIN, URINE-UPE24: NEGATIVE
URINE GLUCOSE: NEGATIVE
UROBILINOGEN UA: 0.2 (ref 0.0–1.0)
WBC, UA: NONE SEEN (ref 0–?)

## 2017-06-19 LAB — BASIC METABOLIC PANEL
BUN: 21 mg/dL (ref 6–23)
CO2: 32 mEq/L (ref 19–32)
CREATININE: 1.01 mg/dL (ref 0.40–1.50)
Calcium: 9.7 mg/dL (ref 8.4–10.5)
Chloride: 101 mEq/L (ref 96–112)
GFR: 80.09 mL/min (ref >60.00)
Glucose, Bld: 157 mg/dL — ABNORMAL HIGH (ref 70–99)
POTASSIUM: 4.7 meq/L (ref 3.5–5.1)
Sodium: 138 mEq/L (ref 135–145)

## 2017-06-19 LAB — CBC WITH DIFFERENTIAL/PLATELET
BASOS ABS: 0.1 10*3/uL (ref 0.0–0.1)
BASOS PCT: 1.7 % (ref 0.0–3.0)
EOS ABS: 0.4 10*3/uL (ref 0.0–0.7)
Eosinophils Relative: 6.5 % — ABNORMAL HIGH (ref 0.0–5.0)
HCT: 41.2 % (ref 39.0–52.0)
Hemoglobin: 13.7 g/dL (ref 13.0–17.0)
LYMPHS ABS: 1.7 10*3/uL (ref 0.7–4.0)
Lymphocytes Relative: 31.1 % (ref 12.0–46.0)
MCHC: 33.2 g/dL (ref 30.0–36.0)
MCV: 93 fl (ref 78.0–100.0)
Monocytes Absolute: 0.4 10*3/uL (ref 0.1–1.0)
Monocytes Relative: 7.3 % (ref 3.0–12.0)
NEUTROS ABS: 2.9 10*3/uL (ref 1.4–7.7)
NEUTROS PCT: 53.4 % (ref 43.0–77.0)
PLATELETS: 293 10*3/uL (ref 150.0–400.0)
RBC: 4.43 Mil/uL (ref 4.22–5.81)
RDW: 13.4 % (ref 11.5–15.5)
WBC: 5.5 10*3/uL (ref 4.0–10.5)

## 2017-06-19 LAB — LIPID PANEL
CHOL/HDL RATIO: 2
CHOLESTEROL: 156 mg/dL (ref 0–200)
HDL: 69.4 mg/dL (ref >39.00)
LDL CALC: 75 mg/dL (ref 0–99)
NONHDL: 86.27
Triglycerides: 56 mg/dL (ref 0.0–149.0)
VLDL: 11.2 mg/dL (ref 0.0–40.0)

## 2017-06-19 LAB — MICROALBUMIN / CREATININE URINE RATIO
Creatinine,U: 185.1 mg/dL
MICROALB UR: 0.8 mg/dL (ref 0.0–1.9)
Microalb Creat Ratio: 0.4 mg/g (ref 0.0–30.0)

## 2017-06-19 LAB — PSA: PSA: 4.23 ng/mL — ABNORMAL HIGH (ref 0.10–4.00)

## 2017-06-19 LAB — POCT GLYCOSYLATED HEMOGLOBIN (HGB A1C): HEMOGLOBIN A1C: 6.9

## 2017-06-19 LAB — TSH: TSH: 3.82 u[IU]/mL (ref 0.35–4.50)

## 2017-06-19 MED ORDER — DEXCOM G6 TRANSMITTER MISC: 1.0000 | Freq: Once | 0 refills | Status: AC

## 2017-06-19 MED ORDER — DEXCOM G6 TRANSMITTER MISC: 1.0000 | Freq: Once | 0 refills | Status: DC

## 2017-06-19 MED ORDER — INSULIN ASPART 100 UNIT/ML FLEXPEN: 10.0000 [IU] | PEN_INJECTOR | Freq: Three times a day (TID) | SUBCUTANEOUS | 11 refills | Status: DC

## 2017-06-19 MED ORDER — DEXCOM G6 SENSOR MISC: 1.0000 | 11 refills | Status: DC

## 2017-06-19 NOTE — Patient Instructions (Addendum)
blood tests are requested for you today.  We'll let you know about the results. I have sent a prescription to your pharmacy, to change humalog to "Fiasp," and for the continuous glucose monitor.  Please consider these measures for your health:  minimize alcohol.  Do not use tobacco products.  Have a colonoscopy at least every 10 years from age 60.  Women should have an annual mammogram from age 60.  Keep firearms safely stored.  Always use seat belts.  have working smoke alarms in your home.  See an eye doctor and dentist regularly.  Never drive under the influence of alcohol or drugs (including prescription drugs).  Those with fair skin should take precautions against the sun, and should carefully examine their skin once per month, for any new or changed moles. Please come back for a follow-up appointment in 6 months.

## 2017-06-19 NOTE — Progress Notes (Signed)
Subjective:    Patient ID: Randy Wallace, male    DOB: 10/11/1955, 61 y.o.   MRN: [id number]  HPI  Pt is here for regular wellness examination, and is feeling pretty well in general, and says chronic med probs are stable, except as noted below.  he says cbg's are over 200 30 mibs after eating.  He then has mild hypoglycemia 2 hrs later.  Left little finger still hurts.  Past Medical History:  Diagnosis Date  . DIABETES MELLITUS, TYPE I 02/18/2007  . ED (erectile dysfunction)   . HYPERCHOLESTEROLEMIA 12/29/2007    History reviewed. No pertinent surgical history.  Social History   Socioeconomic History  . Marital status: Married    Spouse name: Not on file  . Number of children: 2  . Years of education: Not on file  . Highest education level: Not on file  Social Needs  . Financial resource strain: Not on file  . Food insecurity - worry: Not on file  . Food insecurity - inability: Not on file  . Transportation needs - medical: Not on file  . Transportation needs - non-medical: Not on file  Occupational History  . Occupation: Science writerMarketing Consultant  Tobacco Use  . Smoking status: Never Smoker  . Smokeless tobacco: Never Used  Substance and Sexual Activity  . Alcohol use: Yes  . Drug use: Not on file  . Sexual activity: Not on file  Other Topics Concern  . Not on file  Social History Narrative  . Not on file    Current Outpatient Medications on File Prior to Visit  Medication Sig Dispense Refill  . ALPRAZolam (XANAX) 0.25 MG tablet Take 1 tablet (0.25 mg total) by mouth 2 (two) times daily as needed for sleep. 30 tablet 0  . aspirin 325 MG tablet Take 325 mg by mouth daily.      . B-D ULTRAFINE III SHORT PEN 31G X 8 MM MISC USE 5 TIMES DAILY 450 each 2  . BD ULTRA-FINE LANCETS lancets Use as instructed 700 each 1  . fluticasone (FLONASE) 50 MCG/ACT nasal spray Place 2 sprays into the nose daily. 16 g 6  . HUMULIN N KWIKPEN 100 UNIT/ML Kiwkpen INJECT 17 UNITS UNDER THE  SKIN AT BEDTIME 30 mL 2  . lovastatin (MEVACOR) 40 MG tablet TAKE 2 TABLETS DAILY 180 tablet 2  . ONE TOUCH ULTRA TEST test strip TEST 7 TIMES DAILY AS DIRECTED 600 each 0   No current facility-administered medications on file prior to visit.     No Known Allergies  Family History  Problem Relation Age of Onset  . Leukemia Mother   . Cancer Father        Stomach Cancer    BP 122/80 (BP Location: Left Arm, Patient Position: Sitting, Cuff Size: Normal)   Pulse 80   Wt 167 lb 6.4 oz (75.9 kg)   SpO2 98%   BMI 24.02 kg/m    Review of Systems Denies fever, fatigue, visual loss, hearing loss, chest pain, sob, back pain, depression, cold intolerance, BRBPR, hematuria, syncope, numbness, easy bruising, and rash. He has mild hypoglycemia approx 1-4 times per month. He has rhinorrhea.     Objective:   Physical Exam VS: see vs page GEN: no distress HEAD: head: no deformity eyes: no periorbital swelling, no proptosis external nose and ears are normal mouth: no lesion seen NECK: supple, thyroid is not enlarged CHEST WALL: no deformity LUNGS: clear to auscultation BREASTS:  No gynecomastia CV: reg  rate and rhythm, no murmur ABD: abdomen is soft, nontender.  no hepatosplenomegaly.  not distended.  no hernia RECTAL: normal external and internal exam.  heme neg. PROSTATE:  Normal size.  No nodule MUSCULOSKELETAL: muscle bulk and strength are grossly normal.  no obvious joint swelling.  gait is normal and steady EXTEMITIES: no deformity.  no ulcer on the feet.  feet are of normal color and temp.  no edema PULSES: dorsalis pedis intact bilat.  no carotid bruit NEURO:  cn 2-12 grossly intact.   readily moves all 4's.  sensation is intact to touch on the feet SKIN:  Normal texture and temperature.  No rash or suspicious lesion is visible.   NODES:  None palpable at the neck PSYCH: alert, well-oriented.  Does not appear anxious nor depressed.  Lab Results  Component Value Date    HGBA1C 6.9 06/19/2017   I personally reviewed electrocardiogram tracing (today): Indication: wellness Impression: NSR.  No MI.  No hypertrophy. Compared to 2017: no significant change     Assessment & Plan:  Wellness visit today, with problems stable, except as noted.   Patient Instructions  blood tests are requested for you today.  We'll let you know about the results. I have sent a prescription to your pharmacy, to change humalog to "Fiasp," and for the continuous glucose monitor.  Please consider these measures for your health:  minimize alcohol.  Do not use tobacco products.  Have a colonoscopy at least every 10 years from age 60.  Women should have an annual mammogram from age 60.  Keep firearms safely stored.  Always use seat belts.  have working smoke alarms in your home.  See an eye doctor and dentist regularly.  Never drive under the influence of alcohol or drugs (including prescription drugs).  Those with fair skin should take precautions against the sun, and should carefully examine their skin once per month, for any new or changed moles. Please come back for a follow-up appointment in 6 months.

## 2017-06-21 ENCOUNTER — Encounter: Payer: Self-pay | Admitting: Endocrinology

## 2017-06-22 NOTE — Telephone Encounter (Signed)
Pt called to verfy what was sent over to [facility] pharmacy before he goes to pick up  Please advise

## 2017-06-23 ENCOUNTER — Other Ambulatory Visit: Payer: Self-pay

## 2017-06-23 ENCOUNTER — Telehealth: Payer: Self-pay

## 2017-06-23 MED ORDER — INSULIN LISPRO 100 UNIT/ML (KWIKPEN): PEN_INJECTOR | SUBCUTANEOUS | 3 refills | Status: DC

## 2017-06-23 NOTE — Telephone Encounter (Signed)
Called patient to let him know I changed his prescription from Fiasp to Humalog Stephanie Coupkwikpen because that it what his insurance prefers- I was unable to leave a message because his mailbox was-just an [place]

## 2017-07-03 NOTE — Telephone Encounter (Signed)
The humalog is preferred by his insurance compared to the fiasp [facility] day.  And the [facility]

## 2017-07-27 ENCOUNTER — Other Ambulatory Visit: Payer: Self-pay

## 2017-07-27 ENCOUNTER — Telehealth: Payer: Self-pay | Admitting: Endocrinology

## 2017-07-27 MED ORDER — DEXCOM G6 SENSOR MISC: 1.0000 | 11 refills | Status: DC

## 2017-07-27 NOTE — Telephone Encounter (Signed)
[facility]  

## 2017-07-27 NOTE — Telephone Encounter (Signed)
Patient need refill for the Continuous Blood Gluc Sensor (DEXCOM G6 SENSOR) MISC [[id number]][[id number]]  3 month supply  Send to Pharmacy:  [facility] [place] [address]

## 2017-08-17 ENCOUNTER — Other Ambulatory Visit: Payer: Self-pay | Admitting: Endocrinology

## 2017-08-17 DIAGNOSIS — R972 Elevated prostate specific antigen [PSA]: Secondary | ICD-10-CM

## 2017-11-16 ENCOUNTER — Telehealth: Payer: Self-pay

## 2017-11-16 DIAGNOSIS — M79645 Pain in left finger(s): Secondary | ICD-10-CM

## 2017-11-16 NOTE — Telephone Encounter (Signed)
Patient requesting referral for finger her broke a while ago- pt stated MD told him he should get it looked at but he needs a referral for this please call the patient when this has been done at [phone]

## 2017-11-16 NOTE — Telephone Encounter (Signed)
Ok, you will receive a phone call, about a day and time for an appointment 

## 2017-11-17 NOTE — Telephone Encounter (Signed)
LVM stating that referral was sent in & he should receive a call to set up appointment.

## 2017-11-23 ENCOUNTER — Encounter: Payer: Self-pay | Admitting: Endocrinology

## 2017-12-15 ENCOUNTER — Ambulatory Visit: Payer: 59 | Admitting: Endocrinology

## 2017-12-18 NOTE — Telephone Encounter (Signed)
Need rerfill for the Dex com G 6 transmitter  [facility]  [id number] [place], [place] - [address] [phone] (Phone) [phone] (Fax)     Call him when sent

## 2017-12-22 ENCOUNTER — Other Ambulatory Visit: Payer: Self-pay

## 2017-12-22 ENCOUNTER — Other Ambulatory Visit: Payer: Self-pay | Admitting: Endocrinology

## 2017-12-22 ENCOUNTER — Telehealth: Payer: Self-pay | Admitting: Endocrinology

## 2017-12-22 MED ORDER — DEXCOM G6 SENSOR MISC: 1.0000 | 11 refills | Status: DC

## 2017-12-22 NOTE — Telephone Encounter (Signed)
I have [facility] However I am going ahead & sending to requested pharmacy.

## 2017-12-22 NOTE — Telephone Encounter (Signed)
Continuous Blood Gluc Sensor (DEXCOM G6 SENSOR) MISC        Patient is wanting to verify that this has been sent into the pharmacy    [facility] [place], [place] - [address]

## 2017-12-23 ENCOUNTER — Telehealth: Payer: Self-pay | Admitting: Endocrinology

## 2017-12-23 ENCOUNTER — Other Ambulatory Visit: Payer: Self-pay | Admitting: Endocrinology

## 2017-12-23 ENCOUNTER — Other Ambulatory Visit: Payer: Self-pay

## 2017-12-23 NOTE — Telephone Encounter (Signed)
I called patient & notified him that I have [facility]

## 2017-12-23 NOTE — Telephone Encounter (Signed)
Patient needs Rx for DEXCOM G6 TRANSMITTER [facility] [place] asap. Patient has requested Transmitter 3 times but Pharm was sent Rx for sensors instead. Patient is going on a trip and now will not have a transmitter.

## 2017-12-25 NOTE — Telephone Encounter (Signed)
Referral was sent on 11/23/17.

## 2018-01-11 ENCOUNTER — Other Ambulatory Visit: Payer: Self-pay | Admitting: Endocrinology

## 2018-02-11 ENCOUNTER — Other Ambulatory Visit: Payer: Self-pay | Admitting: Endocrinology

## 2018-02-12 ENCOUNTER — Other Ambulatory Visit: Payer: Self-pay | Admitting: Endocrinology

## 2018-03-08 ENCOUNTER — Telehealth: Payer: Self-pay | Admitting: Endocrinology

## 2018-03-08 ENCOUNTER — Other Ambulatory Visit: Payer: Self-pay

## 2018-03-08 MED ORDER — INSULIN PEN NEEDLE 31G X 8 MM MISC: 2 refills | Status: DC

## 2018-03-08 NOTE — Telephone Encounter (Signed)
° ° °  [facility] [place] - [id number] AT Portal to [facility]  Patient would like a 3 month supply of the   BD ULTRA-FINE LANCETS lancets

## 2018-03-08 NOTE — Telephone Encounter (Signed)
I have sent for patient.  

## 2018-04-02 ENCOUNTER — Other Ambulatory Visit: Payer: Self-pay | Admitting: Endocrinology

## 2018-04-02 NOTE — Telephone Encounter (Signed)
Continuous Blood Gluc Transmit (DEXCOM G6 TRANSMITTER) MISC   Patient is needing this sent into the pharmacy He is stating th pharmacy has been sending fax over and hasnt received a response yet to have these sent    [facility] [place] [place] - [phone] AT Portal to [facility]

## 2018-04-06 MED ORDER — DEXCOM G6 TRANSMITTER MISC: 1.0000 | 3 refills | Status: DC

## 2018-04-07 ENCOUNTER — Other Ambulatory Visit: Payer: Self-pay

## 2018-04-07 MED ORDER — INSULIN LISPRO 100 UNIT/ML (KWIKPEN): PEN_INJECTOR | SUBCUTANEOUS | 11 refills | Status: DC

## 2018-04-09 ENCOUNTER — Other Ambulatory Visit: Payer: Self-pay

## 2018-04-09 MED ORDER — INSULIN ASPART 100 UNIT/ML FLEXPEN: 10.0000 [IU] | PEN_INJECTOR | Freq: Three times a day (TID) | SUBCUTANEOUS | 11 refills | Status: DC

## 2018-04-15 ENCOUNTER — Encounter: Payer: Self-pay | Admitting: Endocrinology

## 2018-04-15 ENCOUNTER — Ambulatory Visit: Payer: BLUE CROSS/BLUE SHIELD | Admitting: Endocrinology

## 2018-04-15 VITALS — BP 118/72 | HR 76 | Ht 70.0 in | Wt 167.0 lb

## 2018-04-15 DIAGNOSIS — Z23 Encounter for immunization: Secondary | ICD-10-CM

## 2018-04-15 DIAGNOSIS — E109 Type 1 diabetes mellitus without complications: Secondary | ICD-10-CM

## 2018-04-15 LAB — POCT GLYCOSYLATED HEMOGLOBIN (HGB A1C): Hemoglobin A1C: 6.8 % — AB (ref 4.0–5.6)

## 2018-04-15 MED ORDER — INSULIN ASPART 100 UNIT/ML FLEXPEN: 10.0000 [IU] | PEN_INJECTOR | Freq: Three times a day (TID) | SUBCUTANEOUS | 11 refills | Status: DC

## 2018-04-15 NOTE — Progress Notes (Signed)
Subjective:    Patient ID: Randy Wallace, male    DOB: 25-Jan-1956, 62 y.o.   MRN: [id number]  HPI Pt returns for f/u of diabetes mellitus: DM type: 1 Dx'ed: 2002 Complications: none Therapy: insulin since 2004 DKA: never Severe hypoglycemia: never Pancreatitis: never Other: he takes multiple daily injections; he declines pump; he has a dexcom G-6 continuous glucose monitor.   Interval history: pt states he feels well in general.  I reviewed continuous glucose monitor data and these episodes are mild.  It varies from 80-200.  It is in general highest in the late afternoon.   Past Medical History:  Diagnosis Date  . DIABETES MELLITUS, TYPE I 02/18/2007  . ED (erectile dysfunction)   . HYPERCHOLESTEROLEMIA 12/29/2007    No past surgical history on file.  Social History   Socioeconomic History  . Marital status: Married    Spouse name: Not on file  . Number of children: 2  . Years of education: Not on file  . Highest education level: Not on file  Occupational History  . Occupation: Science writer  Social Needs  . Financial resource strain: Not on file  . Food insecurity:    Worry: Not on file    Inability: Not on file  . Transportation needs:    Medical: Not on file    Non-medical: Not on file  Tobacco Use  . Smoking status: Never Smoker  . Smokeless tobacco: Never Used  Substance and Sexual Activity  . Alcohol use: Yes  . Drug use: Not on file  . Sexual activity: Not on file  Lifestyle  . Physical activity:    Days per week: Not on file    Minutes per session: Not on file  . Stress: Not on file  Relationships  . Social connections:    Talks on phone: Not on file    Gets together: Not on file    Attends religious service: Not on file    Active member of club or organization: Not on file    Attends meetings of clubs or organizations: Not on file    Relationship status: Not on file  . Intimate partner violence:    Fear of current or ex partner: Not on  file    Emotionally abused: Not on file    Physically abused: Not on file    Forced sexual activity: Not on file  Other Topics Concern  . Not on file  Social History Narrative  . Not on file    Current Outpatient Medications on File Prior to Visit  Medication Sig Dispense Refill  . ALPRAZolam (XANAX) 0.25 MG tablet Take 1 tablet (0.25 mg total) by mouth 2 (two) times daily as needed for sleep. 30 tablet 0  . aspirin 325 MG tablet Take 325 mg by mouth daily.      . BD ULTRA-FINE LANCETS lancets Use as instructed 700 each 1  . Continuous Blood Gluc Sensor (DEXCOM G6 SENSOR) MISC 1 Device by Does not apply route once a week. 4 each 11  . Continuous Blood Gluc Transmit (DEXCOM G6 TRANSMITTER) MISC Inject 1 Device into the skin See admin instructions. Every 3 months 1 each 3  . fluticasone (FLONASE) 50 MCG/ACT nasal spray Place 2 sprays into the nose daily. 16 g 6  . HUMULIN N KWIKPEN 100 UNIT/ML Kiwkpen INJECT 17 UNITS UNDER THE SKIN AT BEDTIME 30 mL 2  . Insulin Pen Needle (B-D ULTRAFINE III SHORT PEN) 31G X 8 MM MISC USE  5 TIMES DAILY 450 each 2  . lovastatin (MEVACOR) 40 MG tablet TAKE 2 TABLETS DAILY 180 tablet 2  . ONE TOUCH ULTRA TEST test strip TEST 7 TIMES DAILY AS DIRECTED 600 each 0   No current facility-administered medications on file prior to visit.     No Known Allergies  Family History  Problem Relation Age of Onset  . Leukemia Mother   . Cancer Father        Stomach Cancer    BP 118/72   Pulse 76   Ht 5\' 10"  (1.778 m)   Wt 167 lb (75.8 kg)   SpO2 96%   BMI 23.96 kg/m    Review of Systems He denies LOC    Objective:   Physical Exam VITAL SIGNS:  See vs page GENERAL: no distress Pulses: foot pulses are intact bilaterally.   MSK: no deformity of the feet or ankles.  CV: no edema of the legs or ankles Skin:  no ulcer on the feet or ankles.  normal color and temp on the feet and ankles Neuro: sensation is intact to touch on the feet and ankles.    Lab  Results  Component Value Date   HGBA1C 6.8 (A) 04/15/2018       Assessment & Plan:  Type 1 DM: well-controlled.    Patient Instructions  Please continue the same insulins.   check your blood sugar 8 times a day.  please keep a record of the readings and bring it to your next appointment here (or you can bring the meter itself).  You can write it on any piece of paper.  please call us sooner if your blood sugar goes below 70, or if you have a lot of readings over 200.    Please come back for a follow-up appointment in 6 months.

## 2018-04-15 NOTE — Patient Instructions (Addendum)
Please continue the same insulins.   check your blood sugar 8 times a day.  please keep a record of the readings and bring it to your next appointment here (or you can bring the meter itself).  You can write it on any piece of paper.  please call us sooner if your blood sugar goes below 70, or if you have a lot of readings over 200.    Please come back for a follow-up appointment in 6 months.

## 2018-06-07 ENCOUNTER — Other Ambulatory Visit: Payer: Self-pay

## 2018-06-07 ENCOUNTER — Telehealth: Payer: Self-pay | Admitting: Endocrinology

## 2018-06-07 MED ORDER — INSULIN ASPART 100 UNIT/ML FLEXPEN: 10.0000 [IU] | PEN_INJECTOR | Freq: Three times a day (TID) | SUBCUTANEOUS | 11 refills | Status: DC

## 2018-06-07 NOTE — Telephone Encounter (Signed)
Received notification from pt re: request to refill Novolog. Request authorized and refill sent as requested.

## 2018-06-07 NOTE — Telephone Encounter (Signed)
Patient has called needing a new RX Novolog long lasting that is 90 day supply. Please Advise, thanks [facility]

## 2018-06-07 NOTE — Telephone Encounter (Signed)
Rx refill has been authorized and sent

## 2018-06-23 ENCOUNTER — Other Ambulatory Visit: Payer: Self-pay

## 2018-06-23 DIAGNOSIS — E109 Type 1 diabetes mellitus without complications: Secondary | ICD-10-CM

## 2018-06-23 MED ORDER — INSULIN PEN NEEDLE 31G X 8 MM MISC: 2 refills | Status: DC

## 2018-06-23 NOTE — Telephone Encounter (Signed)
Received notification from [facility] re: pt request to refill pen needles. Request authorized and refill sent as requested.

## 2018-08-23 ENCOUNTER — Other Ambulatory Visit: Payer: Self-pay

## 2018-08-23 ENCOUNTER — Telehealth: Payer: Self-pay

## 2018-08-23 ENCOUNTER — Telehealth: Payer: Self-pay | Admitting: Endocrinology

## 2018-08-23 DIAGNOSIS — E109 Type 1 diabetes mellitus without complications: Secondary | ICD-10-CM

## 2018-08-23 MED ORDER — INSULIN ASPART 100 UNIT/ML FLEXPEN: 10.0000 [IU] | PEN_INJECTOR | Freq: Three times a day (TID) | SUBCUTANEOUS | 11 refills | Status: DC

## 2018-08-23 NOTE — Telephone Encounter (Signed)
Patient called re:  Patient normally receives a 90 day RX for insulin aspart (NOVOLOG) 100 UNIT/ML FlexPen but when patient picked up the above medication it was only for 50 days. Patient states all RX's sent by Dr. Everardo All should be for 90 days. [facility] ph# [phone].

## 2018-08-23 NOTE — Telephone Encounter (Signed)
Called pt and informed Rx HAD been sent for a 90 day supply. Advised he follow up with [facility] to inquire further re: why they did not process as 90 day supply. May also be limitations on his plan. Verbalized acceptance and understanding.

## 2018-08-23 NOTE — Telephone Encounter (Signed)
My Chart message sent at pt request.

## 2018-08-24 ENCOUNTER — Telehealth: Payer: Self-pay | Admitting: Endocrinology

## 2018-08-24 NOTE — Telephone Encounter (Signed)
Per patient Novolog needs to be called in as a 90 day not a 100 day supply   Call number provider if any questions or concerns

## 2018-08-24 NOTE — Telephone Encounter (Signed)
Please review this Rx. We have sent this Rx multiple times and we are still having issues with it not being a 90 day supply.

## 2018-08-24 NOTE — Telephone Encounter (Signed)
Please advise 

## 2018-08-24 NOTE — Telephone Encounter (Signed)
Attempted to call [facility]. Automatic voice advised to press option for provider, once the option was chosen, automated voice advised to call # on back of insurance card then disconnected. Pt insurance card does not have [facility] listed on his card. Pt will either need to provide EXACT qty of ALLOWED pens/vials EQUAL to 90 day supply OR provide contact # for our office to speak directly with [facility]. Left detailed VM of this information.

## 2018-08-24 NOTE — Telephone Encounter (Signed)
Please ask pharmacy to dispense whatever quantity is for 90 days.

## 2018-08-24 NOTE — Telephone Encounter (Signed)
Called patient and spoke with him regarding this issue. The patient stated that [facility] was refusing to fill his prescription of insulin because this office prescribed what would mathematically work out to be a 100 day supply, rather than exactly a 90 day supply.  The patient was informed that the exact amount of insulin he needs per 90 days would equal exactly 9 pens. (10 units three times daily for 90 days, divided by 300units per pen.)  5 insulin pens come in 1 box, and pharmacies will typically refuse to split a box of insulin pens. Patient was informed by a customer service rep that they would advise the doctors office to change to dose to make it equal a 90 prescription/dose. Pt was informed that this could not be done, because the insulin is dosed based of the patients need, not the length of the prescription. Patient was urged to contact [facility] again and speak with only a registered pharmacist about this issue. Pt was advised that this office sends prescriptions all the time to [facility], and this issue has never been encountered before.  Pt stated that he would then call a pharmacist and speak with them about the issue. Pt stated that what I was telling him makes perfects sense, but he is just attempting to find a way to make sure he gets his insulin.

## 2018-08-24 NOTE — Telephone Encounter (Signed)
Patient is returning call.  °

## 2018-08-25 ENCOUNTER — Other Ambulatory Visit: Payer: Self-pay

## 2018-08-25 DIAGNOSIS — E109 Type 1 diabetes mellitus without complications: Secondary | ICD-10-CM

## 2018-08-25 MED ORDER — INSULIN ASPART 100 UNIT/ML FLEXPEN: 10.0000 [IU] | PEN_INJECTOR | Freq: Three times a day (TID) | SUBCUTANEOUS | 11 refills | Status: DC

## 2018-08-25 MED ORDER — INSULIN PEN NEEDLE 31G X 8 MM MISC: 2 refills | Status: DC

## 2018-10-04 ENCOUNTER — Telehealth: Payer: Self-pay | Admitting: Nutrition

## 2018-10-04 ENCOUNTER — Other Ambulatory Visit: Payer: Self-pay

## 2018-10-04 DIAGNOSIS — E109 Type 1 diabetes mellitus without complications: Secondary | ICD-10-CM

## 2018-10-04 MED ORDER — DEXCOM G6 SENSOR MISC: 1.0000 | 11 refills | Status: DC

## 2018-10-04 MED ORDER — DEXCOM G6 TRANSMITTER MISC: 1.0000 | 3 refills | Status: DC

## 2018-10-05 ENCOUNTER — Telehealth: Payer: Self-pay | Admitting: Endocrinology

## 2018-10-05 ENCOUNTER — Other Ambulatory Visit: Payer: Self-pay

## 2018-10-05 DIAGNOSIS — E109 Type 1 diabetes mellitus without complications: Secondary | ICD-10-CM

## 2018-10-05 MED ORDER — DEXCOM G6 SENSOR MISC: 1.0000 | 11 refills | Status: DC

## 2018-10-05 NOTE — Telephone Encounter (Signed)
90day supply sent.

## 2018-10-05 NOTE — Telephone Encounter (Signed)
Patient is requesting a call back to discuss a prescription that was sent into the pharmacy The pharmacy told the patient that this was only sent for a 21 day prescription and they patient always gets 90 day  Continuous Blood Gluc Sensor (DEXCOM G6 SENSOR) MISC

## 2018-10-05 NOTE — Telephone Encounter (Signed)
Opened in error

## 2018-10-06 ENCOUNTER — Telehealth: Payer: Self-pay | Admitting: Endocrinology

## 2018-10-06 ENCOUNTER — Other Ambulatory Visit: Payer: Self-pay

## 2018-10-06 DIAGNOSIS — E109 Type 1 diabetes mellitus without complications: Secondary | ICD-10-CM

## 2018-10-06 MED ORDER — DEXCOM G6 SENSOR MISC: 1.0000 | 11 refills | Status: DC

## 2018-10-06 NOTE — Telephone Encounter (Signed)
Patient called re: RX for a 90 day supply of Continuous Blood Gluc Sensor (DEXCOM G6 SENSOR) MISC should have been sent to  [facility] [place], [place] - [address] AT Portal to [facility] [employer] [phone] (Phone) [phone] (Fax)   90 Day Day Supply

## 2018-10-06 NOTE — Telephone Encounter (Signed)
Continuous Blood Gluc Sensor (DEXCOM G6 SENSOR) MISC 13 each 11 10/06/2018    Sig - Route: 1 Device by Does not apply route once a week. - Does not apply   Sent to pharmacy as: Continuous Blood Gluc Sensor (DEXCOM G6 SENSOR) Misc   E-Prescribing Status: Receipt confirmed by pharmacy (10/06/2018 12:45 PM EDT)

## 2018-10-08 ENCOUNTER — Other Ambulatory Visit: Payer: Self-pay

## 2018-10-08 DIAGNOSIS — E109 Type 1 diabetes mellitus without complications: Secondary | ICD-10-CM

## 2018-10-08 MED ORDER — DEXCOM G6 SENSOR MISC: 1.0000 | 11 refills | Status: DC

## 2018-10-11 ENCOUNTER — Other Ambulatory Visit: Payer: Self-pay

## 2018-10-11 DIAGNOSIS — E109 Type 1 diabetes mellitus without complications: Secondary | ICD-10-CM

## 2018-10-11 MED ORDER — DEXCOM G6 SENSOR MISC: 1.0000 | 1 refills | Status: DC

## 2018-10-13 ENCOUNTER — Other Ambulatory Visit: Payer: Self-pay | Admitting: Endocrinology

## 2018-10-13 ENCOUNTER — Telehealth: Payer: Self-pay | Admitting: Endocrinology

## 2018-10-13 DIAGNOSIS — E109 Type 1 diabetes mellitus without complications: Secondary | ICD-10-CM

## 2018-10-13 MED ORDER — INSULIN ISOPHANE HUMAN 100 UNIT/ML KWIKPEN: PEN_INJECTOR | SUBCUTANEOUS | 2 refills | Status: DC

## 2018-10-13 MED ORDER — DEXCOM G6 SENSOR MISC: 1.0000 | 3 refills | Status: DC

## 2018-10-13 NOTE — Telephone Encounter (Signed)
MEDICATION: HUMULIN N KWIKPEN 100 UNIT/ML Kiwkpen  PHARMACY:  [facility]  IS THIS A 90 DAY SUPPLY : Yes  IS PATIENT OUT OF MEDICATION:   IF NOT; HOW MUCH IS LEFT:   LAST APPOINTMENT DATE: @3 /23/2020  NEXT APPOINTMENT DATE:@3 /26/2020  DO WE HAVE YOUR PERMISSION TO LEAVE A DETAILED MESSAGE:  OTHER COMMENTS:    **Let patient know to contact pharmacy at the end of the day to make sure medication is ready. **  ** Please notify patient to allow 48-72 hours to process**  **Encourage patient to contact the pharmacy for refills or they can request refills through [facility]**

## 2018-10-13 NOTE — Telephone Encounter (Signed)
Refilled Rx. humilin kwikpen 100 unit [facility]

## 2018-10-14 ENCOUNTER — Ambulatory Visit (INDEPENDENT_AMBULATORY_CARE_PROVIDER_SITE_OTHER): Payer: BLUE CROSS/BLUE SHIELD | Admitting: Endocrinology

## 2018-10-14 ENCOUNTER — Ambulatory Visit: Payer: 59 | Admitting: Endocrinology

## 2018-10-14 ENCOUNTER — Other Ambulatory Visit: Payer: Self-pay

## 2018-10-14 DIAGNOSIS — E109 Type 1 diabetes mellitus without complications: Secondary | ICD-10-CM

## 2018-10-14 DIAGNOSIS — R972 Elevated prostate specific antigen [PSA]: Secondary | ICD-10-CM | POA: Diagnosis not present

## 2018-10-14 MED ORDER — INSULIN ISOPHANE HUMAN 100 UNIT/ML KWIKPEN: 17.0000 [IU] | PEN_INJECTOR | Freq: Every day | SUBCUTANEOUS | 3 refills | Status: DC

## 2018-10-14 NOTE — Progress Notes (Addendum)
Subjective:    Patient ID: Randy Wallace, male    DOB: 1956-07-14, 63 y.o.   MRN: [id number]  HPI  telehealth visit today via doxy video visit.  Alternatives to telehealth are presented to this patient, and the patient agrees to the telehealth visit. Pt is advised of the cost of the visit, and agrees to this, also.   Patient is at home, and I am at the office.   Pt returns for f/u of diabetes mellitus: DM type: 1 Dx'ed: 2002 Complications: none Therapy: insulin since 2004.  DKA: never Severe hypoglycemia: never.  Pancreatitis: never.   Other: he takes multiple daily injections; he declines pump; he has a dexcom G-6 continuous glucose monitor.   Interval history: pt states he feels well in general.  It varies from 65-325, but most are in the 100's.  It is in general highest at hs.  He injects mostly at the abdomen.  Denies inject site problems.   Past Medical History:  Diagnosis Date  . DIABETES MELLITUS, TYPE I 02/18/2007  . ED (erectile dysfunction)   . HYPERCHOLESTEROLEMIA 12/29/2007    No past surgical history on file.  Social History   Socioeconomic History  . Marital status: Married    Spouse name: Not on file  . Number of children: 2  . Years of education: Not on file  . Highest education level: Not on file  Occupational History  . Occupation: Science writer  Social Needs  . Financial resource strain: Not on file  . Food insecurity:    Worry: Not on file    Inability: Not on file  . Transportation needs:    Medical: Not on file    Non-medical: Not on file  Tobacco Use  . Smoking status: Never Smoker  . Smokeless tobacco: Never Used  Substance and Sexual Activity  . Alcohol use: Yes  . Drug use: Not on file  . Sexual activity: Not on file  Lifestyle  . Physical activity:    Days per week: Not on file    Minutes per session: Not on file  . Stress: Not on file  Relationships  . Social connections:    Talks on phone: Not on file    Gets together:  Not on file    Attends religious service: Not on file    Active member of club or organization: Not on file    Attends meetings of clubs or organizations: Not on file    Relationship status: Not on file  . Intimate partner violence:    Fear of current or ex partner: Not on file    Emotionally abused: Not on file    Physically abused: Not on file    Forced sexual activity: Not on file  Other Topics Concern  . Not on file  Social History Narrative  . Not on file    Current Outpatient Medications on File Prior to Visit  Medication Sig Dispense Refill  . ALPRAZolam (XANAX) 0.25 MG tablet Take 1 tablet (0.25 mg total) by mouth 2 (two) times daily as needed for sleep. 30 tablet 0  . aspirin 325 MG tablet Take 325 mg by mouth daily.      . fluticasone (FLONASE) 50 MCG/ACT nasal spray Place 2 sprays into the nose daily. 16 g 6  . insulin aspart (NOVOLOG) 100 UNIT/ML FlexPen Inject 10 Units into the skin 3 (three) times daily with meals. Dispense 90 day qty/supply according to requirements indicated by pt insurance plan. 10 pen  11  . Insulin Pen Needle (B-D ULTRAFINE III SHORT PEN) 31G X 8 MM MISC Use pen needle to inject insulin 4 times per day; Dispense 90 day qty/supply according to requirements as indicated by pt insurance plan. 450 each 2  . lovastatin (MEVACOR) 40 MG tablet TAKE 2 TABLETS DAILY 180 tablet 2  . ONE TOUCH ULTRA TEST test strip TEST 7 TIMES DAILY AS DIRECTED 600 each 0  . BD ULTRA-FINE LANCETS lancets Use as instructed 700 each 1  . Continuous Blood Gluc Sensor (DEXCOM G6 SENSOR) MISC 1 Device by Does not apply route See admin instructions. I device every 10 days 9 each 3  . Continuous Blood Gluc Transmit (DEXCOM G6 TRANSMITTER) MISC Inject 1 Device into the skin See admin instructions. Every 3 months 1 each 3   No current facility-administered medications on file prior to visit.     No Known Allergies  Family History  Problem Relation Age of Onset  . Leukemia Mother    . Cancer Father        Stomach Cancer     Review of Systems He denies severe hypoglycemia     Objective:   Physical Exam        Assessment & Plan:  Type 1 DM: apparently well-controlled Hypoglycemia: mild, but this limits aggressiveness of glycemic control  Patient Instructions  Please continue the same insulins.   Please come in to have the a1c drawn.   check your blood sugar 8 times a day.  please keep a record of the readings and bring it to your next appointment here (or you can bring the meter itself).  You can write it on any piece of paper.  please call us sooner if your blood sugar goes below 70, or if you have a lot of readings over 200.    Please come back for a follow-up appointment in 6 months.

## 2018-10-14 NOTE — Patient Instructions (Addendum)
Please continue the same insulins.   Please come in to have the a1c drawn.   check your blood sugar 8 times a day.  please keep a record of the readings and bring it to your next appointment here (or you can bring the meter itself).  You can write it on any piece of paper.  please call us sooner if your blood sugar goes below 70, or if you have a lot of readings over 200.    Please come back for a follow-up appointment in 6 months.

## 2018-10-19 ENCOUNTER — Other Ambulatory Visit: Payer: Self-pay

## 2018-10-19 ENCOUNTER — Telehealth: Payer: Self-pay | Admitting: Endocrinology

## 2018-10-19 MED ORDER — INSULIN ISOPHANE HUMAN 100 UNIT/ML KWIKPEN: 17.0000 [IU] | PEN_INJECTOR | Freq: Every day | SUBCUTANEOUS | 11 refills | Status: DC

## 2018-10-19 NOTE — Telephone Encounter (Signed)
Patient stated that his insurance does not cover the [place]lin  But will cover  Novlin N , he would like this sent in as a 90 day supply    [facility] [place], [place] - [address] AT Portal to Registered [employer] Sites

## 2018-11-22 ENCOUNTER — Other Ambulatory Visit: Payer: Self-pay | Admitting: Endocrinology

## 2018-11-22 NOTE — Telephone Encounter (Signed)
Please refill x 3 months Further refills would have to be considered by new PCP   

## 2018-12-14 ENCOUNTER — Encounter: Payer: Self-pay | Admitting: Endocrinology

## 2019-02-14 ENCOUNTER — Telehealth: Payer: Self-pay | Admitting: Endocrinology

## 2019-02-14 NOTE — Telephone Encounter (Signed)
Dr Loanne Drilling, are we able to order this? Any recommendations for other testing locations patient can go to without an order?

## 2019-02-14 NOTE — Telephone Encounter (Signed)
It is not available at this site.

## 2019-02-14 NOTE — Telephone Encounter (Signed)
Pt called and asked about getting a test for covid because he is moving to [place] and needs a test so he doesn't have to quarantine when he gets there, he needs it around the 03/03/19 before he goes. He doesn't have a PCP with [place] but he does see Dr Loanne Drilling, Can you order that?

## 2019-02-14 NOTE — Telephone Encounter (Signed)
Pt advised to contact local UC or Health Department for instructions.  Pt states that he was also advised to contact [facility] testing center to see if they are doing any non-appt elective testing. Nothing further needed.

## 2019-02-16 ENCOUNTER — Other Ambulatory Visit (INDEPENDENT_AMBULATORY_CARE_PROVIDER_SITE_OTHER): Payer: BC Managed Care – PPO

## 2019-02-16 ENCOUNTER — Other Ambulatory Visit: Payer: Self-pay

## 2019-02-16 DIAGNOSIS — E109 Type 1 diabetes mellitus without complications: Secondary | ICD-10-CM

## 2019-02-16 DIAGNOSIS — R972 Elevated prostate specific antigen [PSA]: Secondary | ICD-10-CM | POA: Diagnosis not present

## 2019-02-16 LAB — HEMOGLOBIN A1C: Hgb A1c MFr Bld: 7.3 % — ABNORMAL HIGH (ref 4.6–6.5)

## 2019-02-16 LAB — PSA: PSA: 2.81 ng/mL (ref 0.10–4.00)

## 2019-03-07 ENCOUNTER — Other Ambulatory Visit: Payer: Self-pay | Admitting: Orthopedic Surgery

## 2019-04-05 ENCOUNTER — Encounter (HOSPITAL_BASED_OUTPATIENT_CLINIC_OR_DEPARTMENT_OTHER): Payer: Self-pay

## 2019-04-06 ENCOUNTER — Encounter (HOSPITAL_BASED_OUTPATIENT_CLINIC_OR_DEPARTMENT_OTHER): Payer: Self-pay | Admitting: *Deleted

## 2019-04-06 ENCOUNTER — Other Ambulatory Visit: Payer: Self-pay

## 2019-04-08 ENCOUNTER — Other Ambulatory Visit: Payer: Self-pay

## 2019-04-08 ENCOUNTER — Other Ambulatory Visit (HOSPITAL_COMMUNITY)
Admission: RE | Admit: 2019-04-08 | Discharge: 2019-04-08 | Disposition: A | Discharge status: Home / Self Care | Payer: BC Managed Care – PPO | Source: Ambulatory Visit | Attending: Orthopedic Surgery | Admitting: Orthopedic Surgery

## 2019-04-08 ENCOUNTER — Encounter (HOSPITAL_BASED_OUTPATIENT_CLINIC_OR_DEPARTMENT_OTHER)
Admission: RE | Admit: 2019-04-08 | Discharge: 2019-04-08 | Disposition: A | Discharge status: Home / Self Care | Payer: BC Managed Care – PPO | Source: Ambulatory Visit | Attending: Orthopedic Surgery | Admitting: Orthopedic Surgery

## 2019-04-08 DIAGNOSIS — Z01812 Encounter for preprocedural laboratory examination: Secondary | ICD-10-CM | POA: Insufficient documentation

## 2019-04-08 DIAGNOSIS — Z20828 Contact with and (suspected) exposure to other viral communicable diseases: Secondary | ICD-10-CM | POA: Insufficient documentation

## 2019-04-08 DIAGNOSIS — Z01818 Encounter for other preprocedural examination: Secondary | ICD-10-CM | POA: Insufficient documentation

## 2019-04-08 DIAGNOSIS — I498 Other specified cardiac arrhythmias: Secondary | ICD-10-CM | POA: Insufficient documentation

## 2019-04-08 DIAGNOSIS — E109 Type 1 diabetes mellitus without complications: Secondary | ICD-10-CM | POA: Insufficient documentation

## 2019-04-08 LAB — BASIC METABOLIC PANEL
Anion gap: 12 (ref 5–15)
BUN: 22 mg/dL (ref 8–23)
CO2: 24 mmol/L (ref 22–32)
Calcium: 9.2 mg/dL (ref 8.9–10.3)
Chloride: 101 mmol/L (ref 98–111)
Creatinine, Ser: 0.97 mg/dL (ref 0.61–1.24)
GFR calc Af Amer: >60 mL/min (ref >60)
GFR calc non Af Amer: >60 mL/min (ref >60)
Glucose, Bld: 150 mg/dL — ABNORMAL HIGH (ref 70–99)
Potassium: 4.5 mmol/L (ref 3.5–5.1)
Sodium: 137 mmol/L (ref 135–145)

## 2019-04-08 NOTE — Progress Notes (Signed)

## 2019-04-09 LAB — NOVEL CORONAVIRUS, NAA (HOSP ORDER, SEND-OUT TO REF LAB; TAT 18-24 HRS): SARS-CoV-2, NAA: NOT DETECTED

## 2019-04-12 ENCOUNTER — Ambulatory Visit (HOSPITAL_BASED_OUTPATIENT_CLINIC_OR_DEPARTMENT_OTHER): Payer: BC Managed Care – PPO | Admitting: Certified Registered"

## 2019-04-12 ENCOUNTER — Encounter (HOSPITAL_BASED_OUTPATIENT_CLINIC_OR_DEPARTMENT_OTHER): Payer: Self-pay | Admitting: Anesthesiology

## 2019-04-12 ENCOUNTER — Other Ambulatory Visit: Payer: Self-pay

## 2019-04-12 ENCOUNTER — Ambulatory Visit (HOSPITAL_BASED_OUTPATIENT_CLINIC_OR_DEPARTMENT_OTHER)
Admission: RE | Admit: 2019-04-12 | Discharge: 2019-04-12 | Disposition: A | Discharge status: Home / Self Care | Payer: BC Managed Care – PPO | Attending: Orthopedic Surgery | Admitting: Orthopedic Surgery

## 2019-04-12 ENCOUNTER — Encounter (HOSPITAL_BASED_OUTPATIENT_CLINIC_OR_DEPARTMENT_OTHER)
Admission: RE | Disposition: A | Discharge status: Home / Self Care | Payer: Self-pay | Source: Home / Self Care | Attending: Orthopedic Surgery

## 2019-04-12 DIAGNOSIS — E7800 Pure hypercholesterolemia, unspecified: Secondary | ICD-10-CM | POA: Insufficient documentation

## 2019-04-12 DIAGNOSIS — Z794 Long term (current) use of insulin: Secondary | ICD-10-CM | POA: Insufficient documentation

## 2019-04-12 DIAGNOSIS — Z833 Family history of diabetes mellitus: Secondary | ICD-10-CM | POA: Insufficient documentation

## 2019-04-12 DIAGNOSIS — M720 Palmar fascial fibromatosis [Dupuytren]: Secondary | ICD-10-CM | POA: Diagnosis not present

## 2019-04-12 DIAGNOSIS — E109 Type 1 diabetes mellitus without complications: Secondary | ICD-10-CM | POA: Diagnosis not present

## 2019-04-12 HISTORY — PX: DUPUYTREN CONTRACTURE RELEASE: SHX1478

## 2019-04-12 LAB — GLUCOSE, CAPILLARY
Glucose-Capillary: 117 mg/dL — ABNORMAL HIGH (ref 70–99)
Glucose-Capillary: 157 mg/dL — ABNORMAL HIGH (ref 70–99)

## 2019-04-12 SURGERY — RELEASE, DUPUYTREN CONTRACTURE
Anesthesia: Monitor Anesthesia Care | Site: Hand | Laterality: Left

## 2019-04-12 MED ORDER — PROPOFOL 500 MG/50ML IV EMUL
INTRAVENOUS | Status: DC | PRN
  Administered 2019-04-12: 100 ug/kg/min via INTRAVENOUS

## 2019-04-12 MED ORDER — LACTATED RINGERS IV SOLN
INTRAVENOUS | Status: DC
  Administered 2019-04-12 (×2): via INTRAVENOUS

## 2019-04-12 MED ORDER — MIDAZOLAM HCL 2 MG/2ML IJ SOLN
INTRAMUSCULAR | Status: AC
  Filled 2019-04-12: qty 2

## 2019-04-12 MED ORDER — THROMBIN 5000 UNITS EX SOLR
CUTANEOUS | Status: DC | PRN
  Administered 2019-04-12: 5000 [IU] via TOPICAL

## 2019-04-12 MED ORDER — FENTANYL CITRATE (PF) 100 MCG/2ML IJ SOLN: 25.0000 ug | INTRAMUSCULAR | Status: DC | PRN

## 2019-04-12 MED ORDER — BUPIVACAINE-EPINEPHRINE (PF) 0.5% -1:200000 IJ SOLN
INTRAMUSCULAR | Status: DC | PRN
  Administered 2019-04-12: 30 mL via PERINEURAL

## 2019-04-12 MED ORDER — FENTANYL CITRATE (PF) 100 MCG/2ML IJ SOLN
INTRAMUSCULAR | Status: AC
  Filled 2019-04-12: qty 2

## 2019-04-12 MED ORDER — OXYMETAZOLINE HCL 0.05 % NA SOLN
NASAL | Status: AC
  Filled 2019-04-12: qty 30

## 2019-04-12 MED ORDER — MIDAZOLAM HCL 2 MG/2ML IJ SOLN
1.0000 mg | INTRAMUSCULAR | Status: DC | PRN
  Administered 2019-04-12 (×2): 1 mg via INTRAVENOUS

## 2019-04-12 MED ORDER — CHLORHEXIDINE GLUCONATE 4 % EX LIQD: 60.0000 mL | Freq: Once | CUTANEOUS | Status: DC

## 2019-04-12 MED ORDER — ONDANSETRON HCL 4 MG/2ML IJ SOLN
4.0000 mg | Freq: Once | INTRAMUSCULAR | Status: AC | PRN
  Administered 2019-04-12: 09:00:00 4 mg via INTRAVENOUS

## 2019-04-12 MED ORDER — CEFAZOLIN SODIUM-DEXTROSE 2-4 GM/100ML-% IV SOLN
INTRAVENOUS | Status: AC
  Filled 2019-04-12: qty 100

## 2019-04-12 MED ORDER — FENTANYL CITRATE (PF) 100 MCG/2ML IJ SOLN
50.0000 ug | INTRAMUSCULAR | Status: DC | PRN
  Administered 2019-04-12: 100 ug via INTRAVENOUS

## 2019-04-12 MED ORDER — TRAMADOL HCL 50 MG PO TABS: 50.0000 mg | ORAL_TABLET | Freq: Four times a day (QID) | ORAL | 0 refills | Status: AC | PRN

## 2019-04-12 MED ORDER — EPHEDRINE 5 MG/ML INJ
INTRAVENOUS | Status: AC
  Filled 2019-04-12: qty 10

## 2019-04-12 MED ORDER — LIDOCAINE 2% (20 MG/ML) 5 ML SYRINGE
INTRAMUSCULAR | Status: DC | PRN
  Administered 2019-04-12: 50 mg via INTRAVENOUS

## 2019-04-12 MED ORDER — LIDOCAINE HCL (PF) 1 % IJ SOLN
INTRAMUSCULAR | Status: AC
  Filled 2019-04-12: qty 5

## 2019-04-12 MED ORDER — CEFAZOLIN SODIUM-DEXTROSE 2-4 GM/100ML-% IV SOLN
2.0000 g | INTRAVENOUS | Status: AC
  Administered 2019-04-12: 08:00:00 2 g via INTRAVENOUS

## 2019-04-12 MED ORDER — SUCCINYLCHOLINE CHLORIDE 200 MG/10ML IV SOSY
PREFILLED_SYRINGE | INTRAVENOUS | Status: AC
  Filled 2019-04-12: qty 10

## 2019-04-12 MED ORDER — BUPIVACAINE HCL (PF) 0.25 % IJ SOLN
INTRAMUSCULAR | Status: AC
  Filled 2019-04-12: qty 90

## 2019-04-12 MED ORDER — SCOPOLAMINE 1 MG/3DAYS TD PT72: 1.0000 | MEDICATED_PATCH | Freq: Once | TRANSDERMAL | Status: DC

## 2019-04-12 MED ORDER — PROPOFOL 10 MG/ML IV BOLUS
INTRAVENOUS | Status: DC | PRN
  Administered 2019-04-12 (×2): 30 mg via INTRAVENOUS

## 2019-04-12 MED ORDER — BUPIVACAINE-EPINEPHRINE 0.25% -1:200000 IJ SOLN
INTRAMUSCULAR | Status: AC
  Filled 2019-04-12: qty 1

## 2019-04-12 MED ORDER — ONDANSETRON HCL 4 MG/2ML IJ SOLN
INTRAMUSCULAR | Status: AC
  Filled 2019-04-12: qty 2

## 2019-04-12 MED ORDER — OXYCODONE HCL 5 MG/5ML PO SOLN: 5.0000 mg | Freq: Once | ORAL | Status: DC | PRN

## 2019-04-12 MED ORDER — THROMBIN 5000 UNITS EX SOLR
CUTANEOUS | Status: AC
  Filled 2019-04-12: qty 5000

## 2019-04-12 MED ORDER — OXYCODONE HCL 5 MG PO TABS: 5.0000 mg | ORAL_TABLET | Freq: Once | ORAL | Status: DC | PRN

## 2019-04-12 MED ORDER — PHENYLEPHRINE 40 MCG/ML (10ML) SYRINGE FOR IV PUSH (FOR BLOOD PRESSURE SUPPORT)
PREFILLED_SYRINGE | INTRAVENOUS | Status: AC
  Filled 2019-04-12: qty 10

## 2019-04-12 MED ORDER — LIDOCAINE 2% (20 MG/ML) 5 ML SYRINGE
INTRAMUSCULAR | Status: AC
  Filled 2019-04-12: qty 5

## 2019-04-12 SURGICAL SUPPLY — 48 items
BLADE MINI RND TIP GREEN BEAV (BLADE) ×3 IMPLANT
BLADE SURG 15 STRL LF DISP TIS (BLADE) ×1 IMPLANT
BLADE SURG 15 STRL SS (BLADE) ×2
BNDG COHESIVE 3X5 TAN STRL LF (GAUZE/BANDAGES/DRESSINGS) ×3 IMPLANT
BNDG ESMARK 4X9 LF (GAUZE/BANDAGES/DRESSINGS) ×3 IMPLANT
BNDG GAUZE ELAST 4 BULKY (GAUZE/BANDAGES/DRESSINGS) ×3 IMPLANT
CHLORAPREP W/TINT 26 (MISCELLANEOUS) ×3 IMPLANT
CORD BIPOLAR FORCEPS 12FT (ELECTRODE) ×3 IMPLANT
COVER BACK TABLE REUSABLE LG (DRAPES) ×3 IMPLANT
COVER MAYO STAND REUSABLE (DRAPES) ×3 IMPLANT
COVER WAND RF STERILE (DRAPES) IMPLANT
CUFF TOURN SGL QUICK 18X4 (TOURNIQUET CUFF) ×3 IMPLANT
DECANTER SPIKE VIAL GLASS SM (MISCELLANEOUS) IMPLANT
DRAPE EXTREMITY T 121X128X90 (DISPOSABLE) ×3 IMPLANT
DRAPE SURG 17X23 STRL (DRAPES) ×3 IMPLANT
GAUZE SPONGE 4X4 12PLY STRL (GAUZE/BANDAGES/DRESSINGS) ×3 IMPLANT
GAUZE XEROFORM 1X8 LF (GAUZE/BANDAGES/DRESSINGS) ×3 IMPLANT
GLOVE BIO SURGEON STRL SZ 6.5 (GLOVE) ×4 IMPLANT
GLOVE BIO SURGEONS STRL SZ 6.5 (GLOVE) ×2
GLOVE BIOGEL M STRL SZ7.5 (GLOVE) ×3 IMPLANT
GLOVE BIOGEL PI IND STRL 7.0 (GLOVE) ×3 IMPLANT
GLOVE BIOGEL PI IND STRL 8.5 (GLOVE) ×1 IMPLANT
GLOVE BIOGEL PI INDICATOR 7.0 (GLOVE) ×6
GLOVE BIOGEL PI INDICATOR 8.5 (GLOVE) ×2
GLOVE SURG ORTHO 8.0 STRL STRW (GLOVE) ×3 IMPLANT
GOWN STRL REUS W/ TWL LRG LVL3 (GOWN DISPOSABLE) ×2 IMPLANT
GOWN STRL REUS W/TWL LRG LVL3 (GOWN DISPOSABLE) ×4
GOWN STRL REUS W/TWL XL LVL3 (GOWN DISPOSABLE) ×6 IMPLANT
LOOP VESSEL MAXI BLUE (MISCELLANEOUS) ×3 IMPLANT
NS IRRIG 1000ML POUR BTL (IV SOLUTION) ×3 IMPLANT
PACK BASIN DAY SURGERY FS (CUSTOM PROCEDURE TRAY) ×3 IMPLANT
PAD CAST 3X4 CTTN HI CHSV (CAST SUPPLIES) ×1 IMPLANT
PADDING CAST ABS 3INX4YD NS (CAST SUPPLIES)
PADDING CAST ABS 4INX4YD NS (CAST SUPPLIES) ×2
PADDING CAST ABS COTTON 3X4 (CAST SUPPLIES) IMPLANT
PADDING CAST ABS COTTON 4X4 ST (CAST SUPPLIES) ×1 IMPLANT
PADDING CAST COTTON 3X4 STRL (CAST SUPPLIES) ×2
SLEEVE SCD COMPRESS KNEE MED (MISCELLANEOUS) ×3 IMPLANT
SLING ARM FOAM STRAP LRG (SOFTGOODS) ×3 IMPLANT
SPLINT PLASTER CAST XFAST 3X15 (CAST SUPPLIES) IMPLANT
SPLINT PLASTER XTRA FASTSET 3X (CAST SUPPLIES)
STOCKINETTE 4X48 STRL (DRAPES) ×3 IMPLANT
SUT ETHILON 4 0 PS 2 18 (SUTURE) ×3 IMPLANT
SUT SILK 2 0 PERMA HAND 18 BK (SUTURE) IMPLANT
SYR BULB 3OZ (MISCELLANEOUS) ×3 IMPLANT
SYR CONTROL 10ML LL (SYRINGE) ×3 IMPLANT
TOWEL GREEN STERILE FF (TOWEL DISPOSABLE) ×6 IMPLANT
UNDERPAD 30X36 HEAVY ABSORB (UNDERPADS AND DIAPERS) ×3 IMPLANT

## 2019-04-12 NOTE — Anesthesia Preprocedure Evaluation (Signed)
Anesthesia Evaluation    Airway Mallampati: II  TM Distance: >3 FB Neck ROM: Full    Dental no notable dental hx. (+) Teeth Intact   Pulmonary neg pulmonary ROS,    Pulmonary exam normal breath sounds clear to auscultation       Cardiovascular negative cardio ROS Normal cardiovascular exam Rhythm:Regular Rate:Normal     Neuro/Psych negative neurological ROS  negative psych ROS   GI/Hepatic negative GI ROS, Neg liver ROS,   Endo/Other  diabetes, Well Controlled, Type 2, Insulin DependentHypercholesterolemia  Renal/GU negative Renal ROS   ED    Musculoskeletal DePuytren's contracture left small finger   Abdominal   Peds  Hematology negative hematology ROS (+)   Anesthesia Other Findings   Reproductive/Obstetrics                             Anesthesia Physical Anesthesia Plan  ASA: II  Anesthesia Plan: Regional and MAC   Post-op Pain Management:    Induction: Intravenous  PONV Risk Score and Plan: 1 and Ondansetron and Treatment may vary due to age or medical condition  Airway Management Planned: Natural Airway, Simple Face Mask and Nasal Cannula  Additional Equipment:   Intra-op Plan:   Post-operative Plan:   Informed Consent: I have reviewed the patients History and Physical, chart, labs and discussed the procedure including the risks, benefits and alternatives for the proposed anesthesia with the patient or authorized representative who has indicated his/her understanding and acceptance.     Dental advisory given  Plan Discussed with: CRNA, Anesthesiologist and Surgeon  Anesthesia Plan Comments:         Anesthesia Quick Evaluation

## 2019-04-12 NOTE — Op Note (Addendum)
NAME: Randy Wallace MEDICAL RECORD NO: [id number] DATE OF BIRTH: 1956/04/20 FACILITY: [facility] LOCATION: [facility] PHYSICIAN: Nicki Reaper, MD   OPERATIVE REPORT   DATE OF PROCEDURE: 04/12/19    PREOPERATIVE DIAGNOSIS:   Dupuytren's contracture left small finger   POSTOPERATIVE DIAGNOSIS:   Same   PROCEDURE:   Palmar fasciectomy with V-Y advancement flaps left small finger   SURGEON: Cindee Salt, M.D.   ASSISTANT: Annye Rusk, [facility]   ANESTHESIA:  Regional with sedation   INTRAVENOUS FLUIDS:  Per anesthesia flow sheet.   ESTIMATED BLOOD LOSS:  Minimal.   COMPLICATIONS:  None.   SPECIMENS:   Palmar fascia cord   TOURNIQUET TIME:   * Missing tourniquet times found for documented tourniquets in log: [id number] *   DISPOSITION:  Stable to PACU.   INDICATIONS: Patient is a 63 year old male with a history of contracture of his left small finger he has a history of Dupuytren's contracture.  He has elected undergo fasciectomy knowing alternatives including aponeurotomy fasciotomy Xiaflex injection.  Risks and complications have been discussed including infection recurrence injury to arteries nerves tendons complete relief symptoms stiffness dystrophy and recurrence.  Pre-peri-and postoperative course been discussed along with risks and complications.  He is aware that there is no guarantee with the surgery.  In the preoperative area the patient is seen the extremity marked by both patient and surgeon antibiotic given  OPERATIVE COURSE: Patient is brought to the operating room following a supraclavicular block carried out without difficulty in the preoperative area by the anesthesiology department.  He was prepped using ChloraPrep and in supine position with the left arm free.  A three-minute dry time was allowed and a timeout taken confirming patient and procedure.  The limb was exsanguinated with an Esmarch bandage turn placed high in the arm was inflated to 250 mmHg.  A  volar Bruner incision was made over the mid palm carried distally on the small finger.  The cord was immediately apparent proximally.  The neurovascular structures were identified deep to this and protected.  The attachment of the palmar fascia at the level of the flexor retinaculum was then released including the middle ring and small fingers.  The cord was then bluntly sharply dissected distally protecting neurovascular bundles.  It was wrapped around the a 1 2 pulley.  This was relieved protecting the artery and nerve on both radial and ulnar aspect the cord was followed distally inserting on the proximal phalanx.  This was excised and sent to pathology.  Abductor digiti quinti cord was present.  This was detached from the abductor digiti quinti protecting the ulnar neurovascular bundle and followed distally the artery nerve had a spiral component to this these were protected and the cord was removed from the middle phalanx insertion.  The finger came to within 15 degrees of full extension and was manipulated into full extension.  The neurovascular bundles radially and ulnarly were then identified and followed over their entire course and found to be intact.  The wound was copiously irrigated with saline and instilled with thrombin a doubled over vessel loop drain was placed at the base of the wound the views were converted to wise and the wound was closed with interrupted 4-0 nylon sutures.  On deflation of the fingers immediately pink.  He was taken to the recovery room for observation in satisfactory condition.  He will be discharged home to return the hand center [facility] in 1 week on Tylenol ibuprofen with  Ultram as a backup.   Randy Brod, MD Electronically signed, 04/12/19

## 2019-04-12 NOTE — Addendum Note (Signed)
Addendum  created 04/12/19 1041 by Willa Frater, CRNA   Intraprocedure Meds edited

## 2019-04-12 NOTE — H&P (Signed)
  Randy Wallace is an 63 y.o. male.   Chief Complaint: contracture left small fingerHPI: Randy Wallace is a 63 year old left-hand-dominant male referred by Dr. Loanne Wallace for consultation regarding the inability to extend his left small finger. This been present for approximately 1 to 2 years. He states that he sustained an injury approximately 20 years ago when it was hit with a baseball. He is wondering if this had anything to do with the problem that he is experiencing. His ancestry is of [place] descent. He is not complaining of any pain or discomfort with this. He has no lumps on his feet or penis. He has no siblings with similar problems. He does not recall his parents having a finger pulled down. He has a history of diabetes no history of thyroid problems arthritis or gout. Family history is positive diabetes negative for thyroid problems arthritis and gout.     Past Medical History:  Diagnosis Date  . DIABETES MELLITUS, TYPE I 02/18/2007  . ED (erectile dysfunction)   . HYPERCHOLESTEROLEMIA 12/29/2007    Past Surgical History:  Procedure Laterality Date  . COLONOSCOPY    . WRIST FRACTURE SURGERY Right     Family History  Problem Relation Age of Onset  . Leukemia Mother   . Cancer Father        Stomach Cancer   Social History:  reports that he has never smoked. He has never used smokeless tobacco. He reports current alcohol use. He reports that he does not use drugs.  Allergies: No Known Allergies  No medications prior to admission.    No results found for this or any previous visit (from the past 48 hour(s)).  No results found.   Pertinent items are noted in HPI.  Height 5\' 10"  (1.778 m), weight 74.8 kg.  General appearance: alert, cooperative and appears stated age Head: Normocephalic, without obvious abnormality Neck: no JVD Resp: clear to auscultation bilaterally Cardio: regular rate and rhythm, S1, S2 normal, no murmur, click, rub or gallop GI: soft,  non-tender; bowel sounds normal; no masses,  no organomegaly Extremities: contracture left small finger Pulses: 2+ and symmetric Skin: Skin color, texture, turgor normal. No rashes or lesions Neurologic: Grossly normal Incision/Wound: na  Assessment/Plan Assessment:  1. Contracture of palmar fascia    Plan: We have again discussed intervention with him. Would like to proceed with fasciectomy. Pre-peri-and postoperative course are discussed along with risk and complications. He is aware that there is no guarantee to the surgery possibility of infection recurrence injury to arteries nerves tendons complete relief symptoms incomplete extension of his small finger possibility of stiffness possibility loss of the finger. this can be scheduled as an outpatient for fasciectomy left small finger under regional anesthesia.    Randy Wallace 04/12/2019, 5:29 AM

## 2019-04-12 NOTE — Discharge Instructions (Addendum)

## 2019-04-12 NOTE — Transfer of Care (Signed)
Immediate Anesthesia Transfer of Care Note  Patient: Randy Wallace  Procedure(s) Performed: DUPUYTREN CONTRACTURE RELEASE (Left Hand)  Patient Location: PACU  Anesthesia Type:MAC and MAC combined with regional for post-op pain  Level of Consciousness: sedated  Airway & Oxygen Therapy: Patient Spontanous Breathing and Patient connected to face mask oxygen  Post-op Assessment: Report given to RN and Post -op Vital signs reviewed and stable  Post vital signs: Reviewed and stable  Last Vitals:  Vitals Value Taken Time  BP    Temp    Pulse 88 04/12/19 1007  Resp    SpO2 96 % 04/12/19 1007  Vitals shown include unvalidated device data.  Last Pain:  Vitals:   04/12/19 0740  TempSrc: Oral  PainSc: 0-No pain         Complications: No apparent anesthesia complications

## 2019-04-12 NOTE — Anesthesia Procedure Notes (Signed)
Anesthesia Regional Block: Axillary brachial plexus block   Pre-Anesthetic Checklist: ,, timeout performed, Correct Patient, Correct Site, Correct Laterality, Correct Procedure, Correct Position, site marked, Risks and benefits discussed,  Surgical consent,  Pre-op evaluation,  At surgeon's request and post-op pain management  Laterality: Left  Prep: chloraprep       Needles:  Injection technique: Single-shot  Needle Type: Echogenic Stimulator Needle     Needle Length: 9cm  Needle Gauge: 21   Needle insertion depth: 6 cm   Additional Needles:   Procedures:,,,, ultrasound used (permanent image in chart),,,,  Motor weakness within 5 minutes.  Narrative:  Start time: 04/12/2019 7:55 AM End time: 04/12/2019 8:00 AM Injection made incrementally with aspirations every 5 mL.  Performed by: Personally  Anesthesiologist: Josephine Igo, MD  Additional Notes: Timeout performed. Patient sedated. Relevant anatomy ID'd using [place]. Incremental 2-14ml injection of LA with frequent aspiration. Patient tolerated procedure well.        Left Axillary Block

## 2019-04-12 NOTE — Progress Notes (Signed)
Assisted Dr. Foster with left, ultrasound guided, axillary block. Side rails up, monitors on throughout procedure. See vital signs in flow sheet. Tolerated Procedure well. 

## 2019-04-12 NOTE — Anesthesia Postprocedure Evaluation (Signed)
Anesthesia Post Note  Patient: Randy Wallace  Procedure(s) Performed: DUPUYTREN CONTRACTURE RELEASE (Left Hand)     Patient location during evaluation: PACU Anesthesia Type: Regional and MAC Level of consciousness: awake and alert and oriented Pain management: pain level controlled Vital Signs Assessment: post-procedure vital signs reviewed and stable Respiratory status: spontaneous breathing, nonlabored ventilation and respiratory function stable Cardiovascular status: stable and blood pressure returned to baseline Postop Assessment: no apparent nausea or vomiting Anesthetic complications: no    Last Vitals:  Vitals:   04/12/19 1007 04/12/19 1015  BP: 101/67 99/60  Pulse: 88 83  Resp: 13 11  Temp: (!) 36.4 C   SpO2: 96% 99%    Last Pain:  Vitals:   04/12/19 1007  TempSrc:   PainSc: Asleep                 [name]

## 2019-04-12 NOTE — Brief Op Note (Signed)
04/12/2019  9:49 AM  PATIENT:  Ambrose Mantle  63 y.o. male  PRE-OPERATIVE DIAGNOSIS:  LEFT SMALL FINGER DUPUYTREN'S CONTRACTURE  POST-OPERATIVE DIAGNOSIS:  LEFT SMALL FINGER DUPUYTREN'S CONTRACTURE  PROCEDURE:  Procedure(s) with comments: DUPUYTREN CONTRACTURE RELEASE (Left) - AXILLARY BLOCK  SURGEON:  Surgeon(s) and Role:    [name], MD - Primary  PHYSICIAN ASSISTANT:   ASSISTANTS: R Dasnoit,PAC   ANESTHESIA:   regional and IV sedation  EBL:  39ml  BLOOD ADMINISTERED:none  DRAINS:vessel loop  LOCAL MEDICATIONS USED:  NONE  SPECIMEN:  Excision  DISPOSITION OF SPECIMEN:  PATHOLOGY  COUNTS:  YES  TOURNIQUET:  * Missing tourniquet times found for documented tourniquets in log: [id number] *  DICTATION: .Viviann Spare Dictation  PLAN OF CARE: Discharge to home after PACU  PATIENT DISPOSITION:  PACU - hemodynamically stable.

## 2019-04-13 ENCOUNTER — Encounter (HOSPITAL_BASED_OUTPATIENT_CLINIC_OR_DEPARTMENT_OTHER): Payer: Self-pay | Admitting: Orthopedic Surgery

## 2019-04-13 LAB — SURGICAL PATHOLOGY

## 2019-06-02 ENCOUNTER — Telehealth: Payer: Self-pay

## 2019-06-02 NOTE — Telephone Encounter (Signed)
MEDICATION: Insulin NPH, Human,, Isophane, (NOVOLIN N FLEXPEN) 100 UNIT/ML Kiwkpen  [facility] [place] [place] [facility] Registered [employer] [facility]  IS THIS A 90 DAY SUPPLY : yes  IS PATIENT OUT OF MEDICATION:   IF NOT; HOW MUCH IS LEFT:   LAST APPOINTMENT DATE: @7 /27/2020  NEXT APPOINTMENT DATE:@Visit  date not found  DO WE HAVE YOUR PERMISSION TO LEAVE A DETAILED MESSAGE:yes  OTHER COMMENTS: patient says last script was for 89 days and the pharmamcy only gave 30 days supply because it has to be 90 day supply   **Let patient know to contact pharmacy at the end of the day to make sure medication is ready. **  ** Please notify patient to allow 48-72 hours to process**  **Encourage patient to contact the pharmacy for refills or they can request refills through [facility]**

## 2019-06-03 ENCOUNTER — Other Ambulatory Visit: Payer: Self-pay

## 2019-06-03 DIAGNOSIS — E109 Type 1 diabetes mellitus without complications: Secondary | ICD-10-CM

## 2019-06-03 MED ORDER — NOVOLIN N FLEXPEN 100 UNIT/ML ~~LOC~~ SUPN: 17.0000 [IU] | PEN_INJECTOR | Freq: Every day | SUBCUTANEOUS | 0 refills | Status: DC

## 2019-06-03 NOTE — Telephone Encounter (Signed)
Insulin NPH, Human,, Isophane, (NOVOLIN N FLEXPEN) 100 UNIT/ML Kiwkpen 15 mL 0 06/03/2019    Sig - Route: Inject 17 Units into the skin at bedtime. - Subcutaneous   Sent to pharmacy as: Insulin NPH, Human,, Isophane, (NOVOLIN N FLEXPEN) 100 UNIT/ML Kiwkpen   Notes to Pharmacy: 90 day supply requested   E-Prescribing Status: Receipt confirmed by pharmacy (06/03/2019 2:45 PM EST)

## 2019-06-03 NOTE — Telephone Encounter (Signed)
Please refill x 1 F/u is due  

## 2019-06-03 NOTE — Telephone Encounter (Signed)
Patient is scheduled for appointment on 06/29/19 at 10:30 a.m.

## 2019-06-03 NOTE — Telephone Encounter (Signed)
Per Dr. Loanne Drilling, unable to refill medications without an appt. Per Dr. Cordelia Pen request, please call pt to schedule an appt. Once scheduled, please route this message back so Rx's can be refilled.

## 2019-06-03 NOTE — Telephone Encounter (Signed)
Please advise 

## 2019-06-27 ENCOUNTER — Other Ambulatory Visit: Payer: Self-pay

## 2019-06-29 ENCOUNTER — Ambulatory Visit: Payer: BC Managed Care – PPO | Admitting: Endocrinology

## 2019-06-29 ENCOUNTER — Telehealth: Payer: Self-pay | Admitting: Family Medicine

## 2019-06-29 ENCOUNTER — Other Ambulatory Visit: Payer: Self-pay

## 2019-06-29 ENCOUNTER — Other Ambulatory Visit: Payer: Self-pay | Admitting: Endocrinology

## 2019-06-29 ENCOUNTER — Encounter: Payer: Self-pay | Admitting: Endocrinology

## 2019-06-29 VITALS — BP 130/70 | HR 78 | Ht 70.0 in | Wt 166.6 lb

## 2019-06-29 DIAGNOSIS — E109 Type 1 diabetes mellitus without complications: Secondary | ICD-10-CM

## 2019-06-29 LAB — POCT GLYCOSYLATED HEMOGLOBIN (HGB A1C): Hemoglobin A1C: 6.6 % — AB (ref 4.0–5.6)

## 2019-06-29 MED ORDER — INSULIN ASPART 100 UNIT/ML FLEXPEN: 11.0000 [IU] | PEN_INJECTOR | Freq: Three times a day (TID) | SUBCUTANEOUS | 11 refills | Status: DC

## 2019-06-29 MED ORDER — NOVOLIN N FLEXPEN 100 UNIT/ML ~~LOC~~ SUPN: 15.0000 [IU] | PEN_INJECTOR | Freq: Every day | SUBCUTANEOUS | 11 refills | Status: DC

## 2019-06-29 NOTE — Progress Notes (Signed)
Subjective:    Patient ID: Randy Wallace, male    DOB: August 30, 1955, 63 y.o.   MRN: [id number]  HPI Pt returns for f/u of diabetes mellitus: DM type: 1 Dx'ed: 2725 Complications: none Therapy: insulin since 2004.  DKA: never Severe hypoglycemia: never.  Pancreatitis: never.   Other: he takes multiple daily injections; he declines pump; he has a dexcom G-6 continuous glucose monitor.   Interval history: pt states he feels well in general. I reviewed continuous glucose monitor data.  glucose varies from 70-255, but most are in the 100's.  It is in general higher PC than AC.   Past Medical History:  Diagnosis Date  . DIABETES MELLITUS, TYPE I 02/18/2007  . ED (erectile dysfunction)   . HYPERCHOLESTEROLEMIA 12/29/2007    Past Surgical History:  Procedure Laterality Date  . COLONOSCOPY    . 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 WRIST FRACTURE SURGERY Right     Social History   Socioeconomic History  . Marital status: Married    Spouse name: Not on file  . Number of children: 2  . Years of education: Not on file  . Highest education level: Not on file  Occupational History  . Occupation: Armed forces technical officer  Tobacco Use  . Smoking status: Never Smoker  . Smokeless tobacco: Never Used  Substance and Sexual Activity  . Alcohol use: Yes    Comment: social  . Drug use: Never  . Sexual activity: Not on file  Other Topics Concern  . Not on file  Social History Narrative  . Not on file   Social Determinants of Health   Financial Resource Strain:   . Difficulty of Paying Living Expenses: Not on file  Food Insecurity:   . Worried About Charity fundraiser in the Last Year: Not on file  . Ran Out of Food in the Last Year: Not on file  Transportation Needs:   . Lack of Transportation (Medical): Not  on file  . Lack of Transportation (Non-Medical): Not on file  Physical Activity:   . Days of Exercise per Week: Not on file  . Minutes of Exercise per Session: Not on file  Stress:   . Feeling of Stress : Not on file  Social Connections:   . Frequency of Communication with Friends and Family: Not on file  . Frequency of Social Gatherings with Friends and Family: Not on file  . Attends Religious Services: Not on file  . Active Member of Clubs or Organizations: Not on file  . Attends Archivist Meetings: Not on file  . Marital Status: Not on file  Intimate Partner Violence:   . Fear of Current or Ex-Partner: Not on file  . Emotionally Abused: Not on file  . Physically Abused: Not on file  . Sexually Abused: Not on file    Current Outpatient Medications on File Prior to Visit  Medication Sig Dispense Refill  . aspirin 325 MG tablet Take 325 mg by mouth daily.      . BD ULTRA-FINE LANCETS lancets Use as instructed 700 each 1  . Continuous Blood Gluc Sensor (DEXCOM G6 SENSOR) MISC 1 Device by Does not apply route See admin instructions. I device every 10 days 9 each 3  . Continuous Blood Gluc Transmit (DEXCOM G6 TRANSMITTER) MISC Inject 1 Device into the  skin See admin instructions. Every 3 months 1 each 3  . fluticasone (FLONASE) 50 MCG/ACT nasal spray Place 2 sprays into the nose daily. 16 g 6  . lovastatin (MEVACOR) 40 MG tablet TAKE 2 TABLETS DAILY 180 tablet 0  . ONE TOUCH ULTRA TEST test strip TEST 7 TIMES DAILY AS DIRECTED 600 each 0  . traMADol (ULTRAM) 50 MG tablet Take 1 tablet (50 mg total) by mouth every 6 (six) hours as needed. 20 tablet 0   No current facility-administered medications on file prior to visit.    No Known Allergies  Family History  Problem Relation Age of Onset  . Leukemia Mother   . Cancer Father        Stomach Cancer    BP 130/70 (BP Location: Right Arm, Patient Position: Sitting, Cuff Size: Normal)   Pulse 78   Ht 5\' 10"  (1.778 m)    Wt 166 lb 9.6 oz (75.6 kg)   SpO2 97%   BMI 23.90 kg/m    Review of Systems He denies hypoglycemia    Objective:   Physical Exam VITAL SIGNS:  See vs page GENERAL: no distress Pulses: dorsalis pedis intact bilat.   MSK: no deformity of the feet CV: no leg edema Skin:  no ulcer on the feet.  normal color and temp on the feet. Neuro: sensation is intact to touch on the feet.    outside test results are reviewed: A1c=6.7%  Lab Results  Component Value Date   HGBA1C 7.3 (H) 02/16/2019       Assessment & Plan:  Type 1 DM: he needs increased rx.   Patient Instructions  Please change the insulins to the numbers listed below.   check your blood sugar 8 times a day.  please keep a record of the readings and bring it to your next appointment here (or you can bring the meter itself).  You can write it on any piece of paper.  please call 02/18/2019 sooner if your blood sugar goes below 70, or if you have a lot of readings over 200.    Please come back for a follow-up appointment in 6 months.

## 2019-06-29 NOTE — Patient Instructions (Addendum)
Please change the insulins to the numbers listed below.   check your blood sugar 8 times a day.  please keep a record of the readings and bring it to your next appointment here (or you can bring the meter itself).  You can write it on any piece of paper.  please call us sooner if your blood sugar goes below 70, or if you have a lot of readings over 200.    Please come back for a follow-up appointment in 6 months.

## 2019-06-29 NOTE — Telephone Encounter (Signed)
insulin aspart (NOVOLOG) 100 UNIT/ML FlexPen 10 pen 11 06/29/2019    Sig - Route: Inject 11 Units into the skin 3 (three) times daily with meals. - Subcutaneous   Sent to pharmacy as: insulin aspart (NOVOLOG) 100 UNIT/ML FlexPen   E-Prescribing Status: Receipt confirmed by pharmacy (06/29/2019 12:13 PM EST)

## 2019-06-29 NOTE — Telephone Encounter (Signed)
Pt called and needs the insulin aspart (NOVOLOG) 100 UNIT/ML [facility]

## 2019-10-21 ENCOUNTER — Other Ambulatory Visit: Payer: Self-pay | Admitting: Endocrinology

## 2019-10-21 DIAGNOSIS — E109 Type 1 diabetes mellitus without complications: Secondary | ICD-10-CM

## 2019-12-05 ENCOUNTER — Other Ambulatory Visit: Payer: Self-pay | Admitting: Endocrinology

## 2019-12-05 DIAGNOSIS — E109 Type 1 diabetes mellitus without complications: Secondary | ICD-10-CM

## 2020-03-01 ENCOUNTER — Telehealth: Payer: Self-pay | Admitting: Endocrinology

## 2020-03-01 ENCOUNTER — Other Ambulatory Visit: Payer: Self-pay

## 2020-03-01 DIAGNOSIS — E109 Type 1 diabetes mellitus without complications: Secondary | ICD-10-CM

## 2020-03-01 MED ORDER — DEXCOM G6 TRANSMITTER MISC: 1.0000 | 2 refills | Status: DC

## 2020-03-01 NOTE — Telephone Encounter (Signed)
Outpatient Medication Detail   Disp Refills Start End   Continuous Blood Gluc Transmit (DEXCOM G6 TRANSMITTER) MISC 1 each 2 03/01/2020    Sig - Route: Inject 1 Device into the skin See admin instructions. E11.9 - Subcutaneous   Sent to pharmacy as: Continuous Blood Gluc Transmit (DEXCOM G6 TRANSMITTER) Misc   Notes to Pharmacy: MUST KEEP APPT IN ORDER TO PROCESS FUTURE REFILL REQUESTS   E-Prescribing Status: Receipt confirmed by pharmacy (03/01/2020 10:27 AM EDT)    NOV 05/08/20 @ 11am

## 2020-03-01 NOTE — Telephone Encounter (Signed)
Medication Refill Request  . Did you call your pharmacy and request this refill first? YES . If patient has not contacted pharmacy first, instruct them to do so for future refills.  . Remind them that contacting the pharmacy for their refill is the quickest method to get the refill.  . Refill policy also stated that it will take anywhere between 24-72 hours to receive the refill.    Name of medication? DEXCOM G6 Transmitter  Is this a 90 day supply? YES  Name and location [facility]

## 2020-05-08 ENCOUNTER — Other Ambulatory Visit: Payer: Self-pay

## 2020-05-08 ENCOUNTER — Ambulatory Visit: Payer: BC Managed Care – PPO | Admitting: Endocrinology

## 2020-05-08 VITALS — BP 118/82 | HR 60 | Ht 69.25 in | Wt 164.0 lb

## 2020-05-08 DIAGNOSIS — E109 Type 1 diabetes mellitus without complications: Secondary | ICD-10-CM

## 2020-05-08 MED ORDER — NOVOLIN N FLEXPEN 100 UNIT/ML ~~LOC~~ SUPN: 15.0000 [IU] | PEN_INJECTOR | Freq: Every day | SUBCUTANEOUS | 3 refills | Status: DC

## 2020-05-08 MED ORDER — INSULIN ASPART 100 UNIT/ML FLEXPEN: 12.0000 [IU] | PEN_INJECTOR | Freq: Three times a day (TID) | SUBCUTANEOUS | 3 refills | Status: DC

## 2020-05-08 MED ORDER — INSULIN ASPART 100 UNIT/ML FLEXPEN: 12.0000 [IU] | PEN_INJECTOR | Freq: Three times a day (TID) | SUBCUTANEOUS | 11 refills | Status: DC

## 2020-05-08 NOTE — Progress Notes (Signed)
Subjective:    Patient ID: Randy Wallace, male    DOB: Apr 16, 1956, 64 y.o.   MRN: [id number]  HPI Pt returns for f/u of diabetes mellitus: DM type: 1 Dx'ed: 2002 Complications: none Therapy: insulin since 2004.  DKA: never Severe hypoglycemia: never.  Pancreatitis: never.   Other: he takes multiple daily injections; he declines pump; he has a dexcom G-6 continuous glucose monitor; he is retired.   Interval history: pt states he feels well in general. I reviewed continuous glucose monitor data.  glucose varies from 68-240, but most are in the 100's.  It is still in general higher PC than AC.   Past Medical History:  Diagnosis Date  . DIABETES MELLITUS, TYPE I 02/18/2007  . ED (erectile dysfunction)   . HYPERCHOLESTEROLEMIA 12/29/2007    Past Surgical History:  Procedure Laterality Date  . COLONOSCOPY    . 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 WRIST FRACTURE SURGERY Right     Social History   Socioeconomic History  . Marital status: Married    Spouse name: Not on file  . Number of children: 2  . Years of education: Not on file  . Highest education level: Not on file  Occupational History  . Occupation: Science writer  Tobacco Use  . Smoking status: Never Smoker  . Smokeless tobacco: Never Used  Substance and Sexual Activity  . Alcohol use: Yes    Comment: social  . Drug use: Never  . Sexual activity: Not on file  Other Topics Concern  . Not on file  Social History Narrative  . Not on file   Social Determinants of Health   Financial Resource Strain:   . Difficulty of Paying Living Expenses: Not on file  Food Insecurity:   . Worried About Programme researcher, broadcasting/film/video in the Last Year: Not on file  . Ran Out of Food in the Last Year: Not on file  Transportation Needs:   . Lack of  Transportation (Medical): Not on file  . Lack of Transportation (Non-Medical): Not on file  Physical Activity:   . Days of Exercise per Week: Not on file  . Minutes of Exercise per Session: Not on file  Stress:   . Feeling of Stress : Not on file  Social Connections:   . Frequency of Communication with Friends and Family: Not on file  . Frequency of Social Gatherings with Friends and Family: Not on file  . Attends Religious Services: Not on file  . Active Member of Clubs or Organizations: Not on file  . Attends Banker Meetings: Not on file  . Marital Status: Not on file  Intimate Partner Violence:   . Fear of Current or Ex-Partner: Not on file  . Emotionally Abused: Not on file  . Physically Abused: Not on file  . Sexually Abused: Not on file    Current Outpatient Medications on File Prior to Visit  Medication Sig Dispense Refill  . aspirin 325 MG tablet Take 325 mg by mouth daily.      . BD ULTRA-FINE LANCETS lancets Use as instructed 700 each 1  . Continuous Blood Gluc Sensor (DEXCOM G6 SENSOR) MISC USE 1 DEVICE EVERY 10 DAYS.SEE ADMIN INSTRUCTIONS 9 each 3  . Continuous Blood Gluc Transmit (DEXCOM G6 TRANSMITTER) MISC Inject 1 Device into the skin See admin  instructions. E11.9 1 each 2  . fluticasone (FLONASE) 50 MCG/ACT nasal spray Place 2 sprays into the nose daily. 16 g 6  . Insulin Pen Needle (B-D ULTRAFINE III SHORT PEN) 31G X 8 MM MISC USE PEN NEEDLE TO INJECT   INSULIN 4 TIMES A DAY 360 each 2  . ONE TOUCH ULTRA TEST test strip TEST 7 TIMES DAILY AS DIRECTED 600 each 0  . rosuvastatin (CRESTOR) 5 MG tablet Take 5 mg by mouth daily.    . traMADol (ULTRAM) 50 MG tablet Take 1 tablet (50 mg total) by mouth every 6 (six) hours as needed. 20 tablet 0  . lovastatin (MEVACOR) 40 MG tablet TAKE 2 TABLETS DAILY (Patient not taking: Reported on 05/08/2020) 180 tablet 0   No current facility-administered medications on file prior to visit.    No Known  Allergies  Family History  Problem Relation Age of Onset  . Leukemia Mother   . Cancer Father        Stomach Cancer    BP 118/82 (BP Location: Left Arm, Patient Position: Sitting, Cuff Size: Normal)   Pulse 60   Ht 5' 9.25" (1.759 m)   Wt 164 lb (74.4 kg)   SpO2 97%   BMI 24.04 kg/m    Review of Systems He seldom has hypoglycemia, and these episodes are mild.      Objective:   Physical Exam VITAL SIGNS:  See vs page GENERAL: no distress Pulses: dorsalis pedis intact bilat.   MSK: no deformity of the feet CV: no leg edema Skin:  no ulcer on the feet.  normal color and temp on the feet. Neuro: sensation is intact to touch on the feet.     A1c=7.7%    Assessment & Plan:  Type 1 DM: uncontrolled Hypoglycemia, due to insulin: this limits aggressiveness of glycemic control  Patient Instructions  Please increase the Novolog to 12 units 3 times a day (just before each meal), and: Please continue the same NPH. Please come back for a follow-up appointment in 3 months.

## 2020-05-08 NOTE — Patient Instructions (Addendum)
Please increase the Novolog to 12 units 3 times a day (just before each meal), and: Please continue the same NPH. Please come back for a follow-up appointment in 3 months.

## 2020-07-11 ENCOUNTER — Other Ambulatory Visit: Payer: Self-pay | Admitting: Endocrinology

## 2020-07-11 DIAGNOSIS — E109 Type 1 diabetes mellitus without complications: Secondary | ICD-10-CM

## 2020-08-07 ENCOUNTER — Other Ambulatory Visit: Payer: BC Managed Care – PPO

## 2020-08-09 ENCOUNTER — Ambulatory Visit: Payer: BC Managed Care – PPO | Admitting: Endocrinology

## 2020-10-15 ENCOUNTER — Other Ambulatory Visit: Payer: Self-pay | Admitting: Endocrinology

## 2020-10-15 DIAGNOSIS — E109 Type 1 diabetes mellitus without complications: Secondary | ICD-10-CM

## 2020-11-13 ENCOUNTER — Other Ambulatory Visit: Payer: Self-pay | Admitting: Endocrinology

## 2020-11-13 DIAGNOSIS — E109 Type 1 diabetes mellitus without complications: Secondary | ICD-10-CM

## 2020-12-04 ENCOUNTER — Other Ambulatory Visit: Payer: Self-pay | Admitting: Endocrinology

## 2020-12-04 DIAGNOSIS — E109 Type 1 diabetes mellitus without complications: Secondary | ICD-10-CM

## 2020-12-04 MED ORDER — DEXCOM G6 SENSOR MISC: 3 refills | Status: DC

## 2020-12-11 ENCOUNTER — Ambulatory Visit: Payer: BC Managed Care – PPO | Admitting: Endocrinology

## 2020-12-19 ENCOUNTER — Ambulatory Visit: Payer: BC Managed Care – PPO | Admitting: Endocrinology

## 2021-01-07 ENCOUNTER — Telehealth: Payer: Self-pay | Admitting: Endocrinology

## 2021-01-07 NOTE — Telephone Encounter (Signed)
Pt called because Dr called in all hi refills except his Dexcom transmitter. Pt requests we send a refill for:   [facility] transmitter    [phone] [place], [place] - [address] Phone:  [phone]  Fax:  [phone]

## 2021-01-08 ENCOUNTER — Other Ambulatory Visit: Payer: Self-pay

## 2021-01-08 DIAGNOSIS — E109 Type 1 diabetes mellitus without complications: Secondary | ICD-10-CM

## 2021-01-08 MED ORDER — DEXCOM G6 TRANSMITTER MISC: 1.0000 | 2 refills | Status: DC

## 2021-01-08 NOTE — Telephone Encounter (Signed)
Rx sent 

## 2021-02-05 ENCOUNTER — Other Ambulatory Visit: Payer: Self-pay

## 2021-02-05 ENCOUNTER — Ambulatory Visit: Payer: BC Managed Care – PPO | Admitting: Endocrinology

## 2021-02-05 VITALS — BP 116/74 | HR 76 | Ht 69.0 in | Wt 164.0 lb

## 2021-02-05 DIAGNOSIS — E109 Type 1 diabetes mellitus without complications: Secondary | ICD-10-CM | POA: Diagnosis not present

## 2021-02-05 LAB — POCT GLYCOSYLATED HEMOGLOBIN (HGB A1C): Hemoglobin A1C: 6.8 % — AB (ref 4.0–5.6)

## 2021-02-05 MED ORDER — INSULIN ASPART 100 UNIT/ML FLEXPEN: 8.0000 [IU] | PEN_INJECTOR | Freq: Three times a day (TID) | SUBCUTANEOUS | 3 refills | Status: DC

## 2021-02-05 NOTE — Progress Notes (Signed)
Subjective:    Patient ID: Randy Wallace, male    DOB: 03-Feb-1956, 65 y.o.   MRN: [id number]  HPI Pt returns for f/u of diabetes mellitus: DM type: 1 Dx'ed: 2002 Complications: none Therapy: insulin since 2004.  DKA: never Severe hypoglycemia: never.  Pancreatitis: never.   Other: he takes multiple daily injections; he declines pump; he has a dexcom G-6 continuous glucose monitor; he is retired.   Interval history: pt states he feels well in general. I reviewed continuous glucose monitor data.  glucose varies from 80-245, but most are in the 100's.  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   Procedure: DUPUYTREN CONTRACTURE RELEASE;  Surgeon: Cindee Salt, MD;  Location: [facility];  Service: Orthopedics;  Laterality: Left;  AXILLARY BLOCK   WRIST FRACTURE SURGERY Right     Social History   Socioeconomic History   Marital status: Married    Spouse name: Not on file   Number of children: 2   Years of education: Not on file   Highest education level: Not on file  Occupational History   Occupation: Science writer  Tobacco Use   Smoking status: Never   Smokeless tobacco: Never  Substance and Sexual Activity   Alcohol use: Yes    Comment: social   Drug use: Never   Sexual activity: Not on file  Other Topics Concern   Not on file  Social History Narrative   Not on file   Social Determinants of Health   Financial Resource Strain: Not on file  Food Insecurity: Not on file  Transportation Needs: Not on file  Physical Activity: Not on file  Stress: Not on file  Social Connections: Not on file  Intimate Partner Violence: Not on file    Current Outpatient Medications on File Prior to Visit   Medication Sig Dispense Refill   aspirin 325 MG tablet Take 325 mg by mouth daily.       B-D ULTRAFINE III SHORT PEN 31G X 8 MM MISC USE PEN NEEDLE TO INJECT   INSULIN 4 TIMES A DAY 360 each 2   BD ULTRA-FINE LANCETS lancets Use as instructed 700 each 1   Continuous Blood Gluc Sensor (DEXCOM G6 SENSOR) MISC USE 1 DEVICE EVERY 10 DAYS.SEE ADMIN INSTRUCTIONS 9 each 3   Continuous Blood Gluc Transmit (DEXCOM G6 TRANSMITTER) MISC Inject 1 Device into the skin See admin instructions. E11.9 1 each 2   fluticasone (FLONASE) 50 MCG/ACT nasal spray Place 2 sprays into the nose daily. 16 g 6   Insulin NPH, Human,, Isophane, (NOVOLIN N FLEXPEN) 100 UNIT/ML Kiwkpen Inject 15 Units into the skin at bedtime. 15 mL 3   lovastatin (MEVACOR) 40 MG tablet TAKE 2 TABLETS DAILY 180 tablet 0   ONE TOUCH ULTRA TEST test strip TEST 7 TIMES DAILY AS DIRECTED 600 each 0   rosuvastatin (CRESTOR) 5 MG tablet Take 5 mg by mouth daily.     traMADol (ULTRAM) 50 MG tablet Take 1 tablet (50 mg total) by mouth every 6 (six) hours as needed. 20 tablet 0   No current facility-administered medications on file prior to visit.    No Known Allergies  Family History  Problem Relation Age  of Onset   Leukemia Mother    Cancer Father        Stomach Cancer    BP 116/74 (BP Location: Left Arm, Patient Position: Sitting, Cuff Size: Normal)   Pulse 76   Ht 5\' 9"  (1.753 m)   Wt 164 lb (74.4 kg)   SpO2 96%   BMI 24.22 kg/m    Review of Systems He has hypoglycemia approx once per month.  This happens with exertion.      Objective:   Physical Exam Pulses: dorsalis pedis intact bilat.   MSK: no deformity of the feet CV: no leg edema Skin:  no ulcer on the feet.  normal color and temp on the feet.  Neuro: sensation is intact to touch on the feet.    Lab Results  Component Value Date   HGBA1C 6.8 (A) 02/05/2021       Assessment & Plan:  Type 1 DM.  Hypoglycemia, due to insulin: pt says he manages this thru PO  intake, and it is not a big problem for him.  Patient Instructions  Please continue the same 2 insulins. Please come back for a follow-up appointment in 4 months.

## 2021-02-05 NOTE — Patient Instructions (Addendum)
Please continue the same 2 insulins. Please come back for a follow-up appointment in 4 months.

## 2021-02-18 ENCOUNTER — Telehealth: Payer: Self-pay | Admitting: Endocrinology

## 2021-02-18 NOTE — Telephone Encounter (Signed)
Pt is needing a PA for  as well as a prescription Continuous Blood Gluc Sensor (DEXCOM G6 SENSOR) MISC  Continuous Blood Gluc Transmit (DEXCOM G6 TRANSMITTER) MISC   Pt is needing it sent to [facility] phone number [phone] fax number [phone] PA number [phone] Fax number [phone]  Pt is also needing a refill sent to [facility] insulin aspart (NOVOLOG) 100 UNIT/ML FlexPen  Insulin NPH, Human,, Isophane, (NOVOLIN N FLEXPEN) 100 UNIT/ML Kiwkpen  All for 90 days. If any questions can call patient back

## 2021-02-19 ENCOUNTER — Other Ambulatory Visit: Payer: Self-pay

## 2021-02-19 DIAGNOSIS — E109 Type 1 diabetes mellitus without complications: Secondary | ICD-10-CM

## 2021-02-19 MED ORDER — DEXCOM G4 SENSOR MISC: 3 refills | Status: DC

## 2021-02-19 MED ORDER — DEXCOM G4 PLATINUM TRANSMITTER MISC: 0 refills | Status: DC

## 2021-02-19 NOTE — Telephone Encounter (Unsigned)
Patient called to advise that got confirmation of a DEXCOM G4 being sent to pharmacy for him.  He is requesting a call back

## 2021-02-19 NOTE — Telephone Encounter (Signed)
Patient states his insurance pharmacy does not cover Dexcom G6 so he needs he DExcom Actuary Rx written and fax to [facility] # [phone]

## 2021-02-26 ENCOUNTER — Telehealth: Payer: Self-pay | Admitting: Pharmacy Technician

## 2021-02-26 ENCOUNTER — Other Ambulatory Visit: Payer: Self-pay

## 2021-02-26 DIAGNOSIS — E109 Type 1 diabetes mellitus without complications: Secondary | ICD-10-CM

## 2021-02-26 MED ORDER — DEXCOM G6 SENSOR MISC: 3 refills | Status: DC

## 2021-02-26 MED ORDER — DEXCOM G6 TRANSMITTER MISC: 3 refills | Status: DC

## 2021-02-26 NOTE — Telephone Encounter (Signed)
ENDOCRINOLOGY Patient Advocate Encounter  Left a HIPPA compliant voicemail with the patient.  When they return call, we are reaching out to him at his request regarding the Dexcom G4.  He can speak with Kathie Rhodes or Elease Hashimoto.    Netty Starring. Dimas Aguas CPhT Specialty Pharmacy Patient [facility] Phone: [phone] Fax:  [phone]

## 2021-03-05 ENCOUNTER — Telehealth: Payer: Self-pay | Admitting: Endocrinology

## 2021-03-05 NOTE — Telephone Encounter (Signed)
Pt calling in stating that he got a new insurance and the RX plan is [facility] and patient would like prescriptions sent to this fax number [phone]  phone number [phone]. Pt is needing a 90 day supply for these medications sent to [facility]  insulin aspart (NOVOLOG) 100 UNIT/ML FlexPen  Insulin NPH, Human,, Isophane, (NOVOLIN N FLEXPEN) 100 UNIT/ML Kiwkpen  B-D ULTRAFINE III SHORT PEN 31G X 8 MM MISC

## 2021-03-06 ENCOUNTER — Other Ambulatory Visit: Payer: Self-pay | Admitting: *Deleted

## 2021-03-06 DIAGNOSIS — E109 Type 1 diabetes mellitus without complications: Secondary | ICD-10-CM

## 2021-03-06 MED ORDER — NOVOLIN N FLEXPEN 100 UNIT/ML ~~LOC~~ SUPN: 15.0000 [IU] | PEN_INJECTOR | Freq: Every day | SUBCUTANEOUS | 3 refills | Status: AC

## 2021-03-06 MED ORDER — INSULIN ASPART 100 UNIT/ML FLEXPEN: 8.0000 [IU] | PEN_INJECTOR | Freq: Three times a day (TID) | SUBCUTANEOUS | 3 refills | Status: DC

## 2021-03-06 MED ORDER — BD PEN NEEDLE SHORT U/F 31G X 8 MM MISC
2 refills | Status: DC
Start: 2021-03-06 — End: 2021-12-30

## 2021-03-06 NOTE — Telephone Encounter (Signed)
Sent rx to --[facility]

## 2021-03-12 NOTE — Telephone Encounter (Signed)
Patient called re: RX's that were sent for Dexcom G6 Tansmitter and Dexcom G6 Sensors to [facility] requires a PA for both through [employer] If unable to get PA handled through CoverMyMeds website, the ph# for CoverMyMeds is [phone]

## 2021-03-12 NOTE — Telephone Encounter (Signed)
Routed the message to adriane for PA.--Dexcom G6 Tansmitter and Dexcom G6 Sensors

## 2021-03-18 ENCOUNTER — Other Ambulatory Visit (HOSPITAL_COMMUNITY): Payer: Self-pay

## 2021-03-18 NOTE — Telephone Encounter (Signed)
routed

## 2021-03-18 NOTE — Telephone Encounter (Signed)
[facility] part of [employer] - called to check status of PA for Dexcom G6 Tansmitter and Dexcom [employer].  [facility] part of [employer] is requesting a call back with an update:    [phone] ref# [phone]

## 2021-03-19 ENCOUNTER — Other Ambulatory Visit (HOSPITAL_COMMUNITY): Payer: Self-pay

## 2021-03-20 ENCOUNTER — Telehealth: Payer: Self-pay | Admitting: Endocrinology

## 2021-03-20 DIAGNOSIS — E109 Type 1 diabetes mellitus without complications: Secondary | ICD-10-CM

## 2021-03-20 MED ORDER — INSULIN ASPART 100 UNIT/ML FLEXPEN: 8.0000 [IU] | PEN_INJECTOR | Freq: Three times a day (TID) | SUBCUTANEOUS | 3 refills | Status: DC

## 2021-03-20 NOTE — Telephone Encounter (Signed)
Patient called to advise that he has new RX information and that he needs a new Novolog RX sent for 90 days.  New pharmacy information is [facility]

## 2021-03-20 NOTE — Telephone Encounter (Signed)
Rx sent to new pharmacy.

## 2021-03-21 NOTE — Telephone Encounter (Signed)
Patient Advocate Encounter   Received notification from [employer] that a peer to peer review for further consideration may be done. LVM to schedule on 03/18/21   [name] called back today to request the last 2 office visit notes and confirm directions for Novolin N. Faxed this info 03/21/21 To [phone]  Sherilyn Dacosta, CPhT Patient Advocate [facility] Phone: [phone] Fax:  [phone]

## 2021-03-28 ENCOUNTER — Other Ambulatory Visit (HOSPITAL_COMMUNITY): Payer: Self-pay

## 2021-03-28 NOTE — Telephone Encounter (Signed)
Patient Advocate Encounter   Received notification from [employer] that prior authorization for [facility] G6 SENSOR is required.   Status is APPROVED  Through 03/18/2022    [facility] will continue to follow   Jeannette How, CPhT Patient Advocate [facility] Phone: [phone] Fax:  [phone]

## 2021-04-01 ENCOUNTER — Telehealth: Payer: Self-pay

## 2021-04-01 DIAGNOSIS — E109 Type 1 diabetes mellitus without complications: Secondary | ICD-10-CM

## 2021-04-01 MED ORDER — INSULIN ASPART 100 UNIT/ML FLEXPEN: 8.0000 [IU] | PEN_INJECTOR | Freq: Three times a day (TID) | SUBCUTANEOUS | 3 refills | Status: AC

## 2021-04-01 NOTE — Addendum Note (Signed)
Addended by: Lisabeth Pick on: 04/01/2021 09:21 AM   Modules accepted: Orders

## 2021-04-01 NOTE — Telephone Encounter (Signed)
[facility] Called wondering that if Novalog is covered why hasn't. Pt needs prescription, hasn't gotten it. Please call pt [phone]

## 2021-04-01 NOTE — Telephone Encounter (Signed)
Script has been sent and patient notified  

## 2021-07-02 ENCOUNTER — Ambulatory Visit: Payer: BC Managed Care – PPO | Admitting: Endocrinology

## 2021-08-21 ENCOUNTER — Other Ambulatory Visit: Payer: Self-pay | Admitting: Endocrinology

## 2021-08-21 ENCOUNTER — Other Ambulatory Visit: Payer: Self-pay

## 2021-08-21 DIAGNOSIS — E109 Type 1 diabetes mellitus without complications: Secondary | ICD-10-CM

## 2021-08-21 MED ORDER — ONETOUCH ULTRA VI STRP: ORAL_STRIP | 3 refills | Status: DC

## 2021-11-05 ENCOUNTER — Encounter: Payer: Self-pay | Admitting: Endocrinology

## 2021-11-05 ENCOUNTER — Ambulatory Visit (INDEPENDENT_AMBULATORY_CARE_PROVIDER_SITE_OTHER): Payer: Self-pay | Admitting: Endocrinology

## 2021-11-05 VITALS — BP 130/88 | HR 70 | Ht 69.0 in | Wt 168.4 lb

## 2021-11-05 DIAGNOSIS — E1065 Type 1 diabetes mellitus with hyperglycemia: Secondary | ICD-10-CM | POA: Diagnosis not present

## 2021-11-05 DIAGNOSIS — E109 Type 1 diabetes mellitus without complications: Secondary | ICD-10-CM

## 2021-11-05 LAB — POCT GLYCOSYLATED HEMOGLOBIN (HGB A1C): Hemoglobin A1C: 7.2 % — AB (ref 4.0–5.6)

## 2021-11-05 MED ORDER — FREESTYLE LIBRE 2 SENSOR MISC: 1.0000 | 3 refills | Status: AC

## 2021-11-05 NOTE — Progress Notes (Signed)
? ?Subjective:  ? ? Patient ID: Randy Wallace, male    DOB: [birth year], 66 y.o.   MRN: [id number] ? ?HPI ?Pt returns for f/u of diabetes mellitus:  ?DM type: 1 ?Dx'ed: 2002 ?Complications: none ?Therapy: insulin since 2004.  ?DKA: never ?Severe hypoglycemia: never.   ?Pancreatitis: never.   ?Other: he takes multiple daily injections; he declines pump; he is retired.   ?Interval history: pt states he feels well in general. He stopped continuous glucose monitor, due to cost.  He says cbg varies from 48-250, but most are in the 100's.  He has mild hypoglycemia approx QOW.  This usually happens in the afternoon.   ?Past Medical History:  ?Diagnosis Date  ? DIABETES MELLITUS, TYPE I 02/18/2007  ? ED (erectile dysfunction)   ? HYPERCHOLESTEROLEMIA 12/29/2007  ? ? ?Past Surgical History:  ?Procedure Laterality Date  ? COLONOSCOPY    ? 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 Procedure: DUPUYTREN CONTRACTURE RELEASE;  Surgeon: Cindee Salt, MD;  Location: [facility];  Service: Orthopedics;  Laterality: Left;  AXILLARY BLOCK  ? WRIST FRACTURE SURGERY Right   ? ? ?Social History  ? ?Socioeconomic History  ? Marital status: Married  ?  Spouse name: Not on file  ? Number of children: 2  ? Years of education: Not on file  ? Highest education level: Not on file  ?Occupational History  ? Occupation: Science writer  ?Tobacco Use  ? Smoking status: Never  ? Smokeless tobacco: Never  ?Substance and Sexual Activity  ? Alcohol use: Yes  ?  Comment: social  ? Drug use: Never  ? Sexual activity: Not on file  ?Other Topics Concern  ? Not on file  ?Social History Narrative  ? Not on file  ? ?Social Determinants of Health  ? ?Financial Resource Strain: Not on file  ?Food Insecurity: Not on file  ?Transportation Needs: Not on file  ?Physical Activity: Not on file  ?Stress: Not on file  ?Social Connections: Not on file  ?Intimate Partner Violence: Not on file  ? ? ?Current Outpatient Medications on File Prior to Visit   ?Medication Sig Dispense Refill  ? aspirin 325 MG tablet Take 325 mg by mouth daily.      ? BD ULTRA-FINE LANCETS lancets Use as instructed 700 each 1  ? fluticasone (FLONASE) 50 MCG/ACT nasal spray Place 2 sprays into the nose daily. 16 g 6  ? insulin aspart (NOVOLOG) 100 UNIT/ML FlexPen Inject 8-10 Units into the skin 3 (three) times daily with meals. 45 mL 3  ? Insulin NPH, Human,, Isophane, (NOVOLIN N FLEXPEN) 100 UNIT/ML Kiwkpen Inject 15 Units into the skin at bedtime. 15 mL 3  ? Insulin Pen Needle (B-D ULTRAFINE III SHORT PEN) 31G X 8 MM MISC USE PEN NEEDLE TO INJECT   INSULIN 4 TIMES A DAY. 360 each 2  ? lovastatin (MEVACOR) 40 MG tablet TAKE 2 TABLETS DAILY 180 tablet 0  ? ONE TOUCH ULTRA TEST test strip TEST 7 TIMES DAILY AS DIRECTED 600 each 0  ? ONETOUCH ULTRA test strip USE AS INSTRUCTED 600 strip 3  ? rosuvastatin (CRESTOR) 5 MG tablet Take 5 mg by mouth daily.    ? traMADol (ULTRAM) 50 MG tablet Take 1 tablet (50 mg total) by mouth every 6 (six) hours as needed. 20 tablet 0  ? ?No current facility-administered medications on file prior to visit.  ? ? ?No Known Allergies ? ?Family History  ?Problem Relation Age of Onset  ?  Leukemia Mother   ? Cancer Father   ?     Stomach Cancer  ? ? ?BP 130/88 (BP Location: Left Arm, Patient Position: Sitting, Cuff Size: Normal)   Pulse 70   Ht 5\' 9"  (1.753 m)   Wt 168 lb 6.4 oz (76.4 kg)   SpO2 98%   BMI 24.87 kg/m?  ? ? ?Review of Systems ? ?   ?Objective:  ? Physical Exam ?VITAL SIGNS:  See vs page.   ?GENERAL: no distress.   ? ? ?Lab Results  ?Component Value Date  ? HGBA1C 7.2 (A) 11/05/2021  ? ?   ?Assessment & Plan:  ?Type 1 DM: uncontrolled ?Hypoglycemia, due to insulin: this limits aggressiveness of glycemic control ? ?Patient Instructions  ?Please continue the same 2 insulins.   ?I have sent a prescription to your pharmacy, for a different brand of continuous glucose monitor sensors.   ?You should have an endocrinology follow-up appointment in 4  months.  ? ? ?

## 2021-11-05 NOTE — Patient Instructions (Addendum)
Please continue the same 2 insulins.   ?I have sent a prescription to your pharmacy, for a different brand of continuous glucose monitor sensors.   ?You should have an endocrinology follow-up appointment in 4 months.  ?

## 2021-11-11 ENCOUNTER — Other Ambulatory Visit: Payer: Self-pay

## 2021-11-11 DIAGNOSIS — E109 Type 1 diabetes mellitus without complications: Secondary | ICD-10-CM

## 2021-11-11 MED ORDER — FREESTYLE LIBRE 2 READER DEVI: 0 refills | Status: AC

## 2021-12-30 ENCOUNTER — Other Ambulatory Visit: Payer: Self-pay

## 2021-12-30 DIAGNOSIS — E109 Type 1 diabetes mellitus without complications: Secondary | ICD-10-CM

## 2021-12-30 MED ORDER — BD PEN NEEDLE SHORT U/F 31G X 8 MM MISC: 2 refills | Status: AC
